# Patient Record
Sex: Male | Born: 1970 | Race: White | Hispanic: No | Marital: Married | Smoking: Current every day smoker
Health system: Southern US, Community
[De-identification: ages and names within clinical notes are randomized; demographics above are authoritative.]

## PROBLEM LIST (undated history)

## (undated) DIAGNOSIS — K5090 Crohn's disease, unspecified, without complications: Secondary | ICD-10-CM

## (undated) DIAGNOSIS — F419 Anxiety disorder, unspecified: Secondary | ICD-10-CM

## (undated) HISTORY — DX: Crohn's disease, unspecified, without complications: K50.90

## (undated) HISTORY — DX: Anxiety disorder, unspecified: F41.9

---

## 2001-06-26 ENCOUNTER — Encounter: Payer: Self-pay | Admitting: *Deleted

## 2001-06-26 ENCOUNTER — Emergency Department (HOSPITAL_COMMUNITY): Admission: EM | Admit: 2001-06-26 | Discharge: 2001-06-26 | Payer: Self-pay | Admitting: *Deleted

## 2005-12-27 ENCOUNTER — Emergency Department (HOSPITAL_COMMUNITY): Admission: EM | Admit: 2005-12-27 | Discharge: 2005-12-27 | Payer: Self-pay | Admitting: Emergency Medicine

## 2009-03-14 ENCOUNTER — Ambulatory Visit (HOSPITAL_COMMUNITY): Admission: RE | Admit: 2009-03-14 | Discharge: 2009-03-14 | Payer: Self-pay | Admitting: Internal Medicine

## 2009-03-14 IMAGING — CT CT ABDOMEN WO/W CM
2 of 7 series · 13 of 46 positions shown, 18 images · IV contrast (Omnipaque 300)
Comparison: None

CT ABDOMEN

CLINICAL DATA: Hematuria.  Bilateral flank pain.

CT ABDOMEN AND PELVIS WITHOUT AND WITH CONTRAST
TECHNIQUE: Multidetector CT imaging of the abdomen and pelvis was
performed following the standard protocol before and following the
bolus administration of intravenous contrast.
Contrast: 100 ml Gmnipaque-BLL

[Series 4: mpr coro pre contrast (id) · coronal · non-contrast · 0.78mm/px · 3 of 84 slices shown]
[im 21/84  soft-tissue]
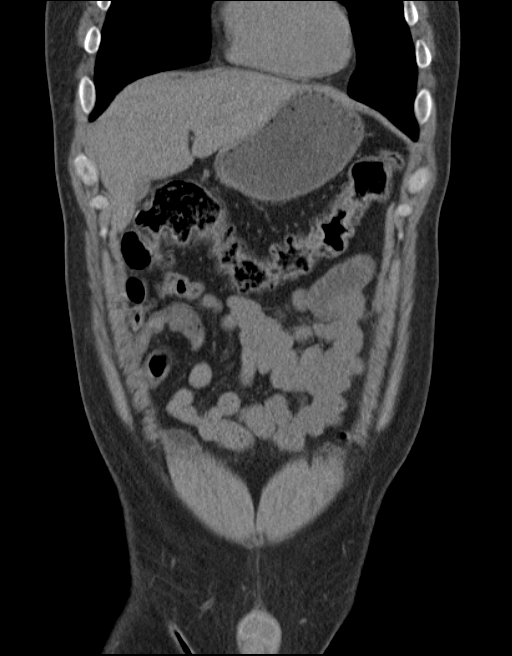
[im 42/84  soft-tissue]
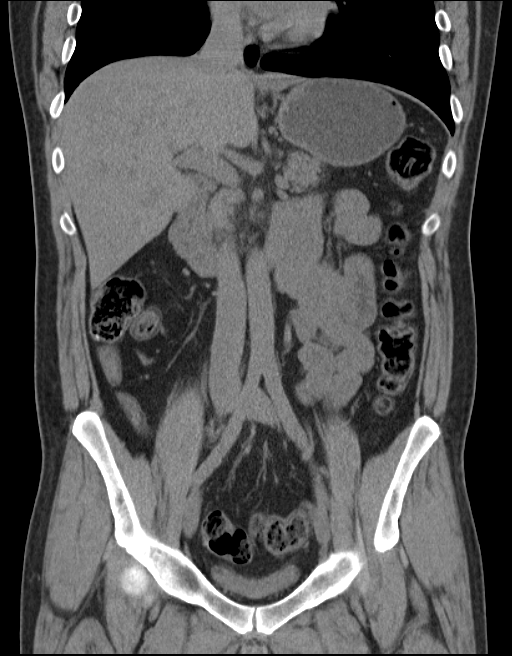
[im 63/84  soft-tissue]
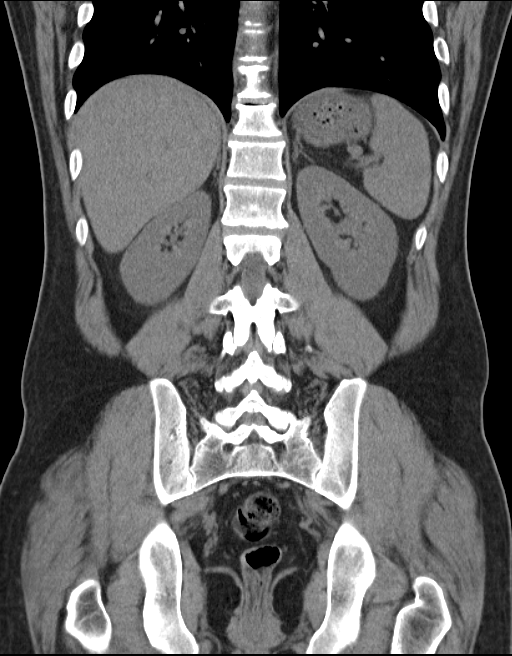

[Series 7: abd_pel_with 5.0 b40f · axial · 0.82mm/px · z∈[-452,-12]mm · 10 of 104 slices shown, 15 images]
[im 8/104  soft-tissue]
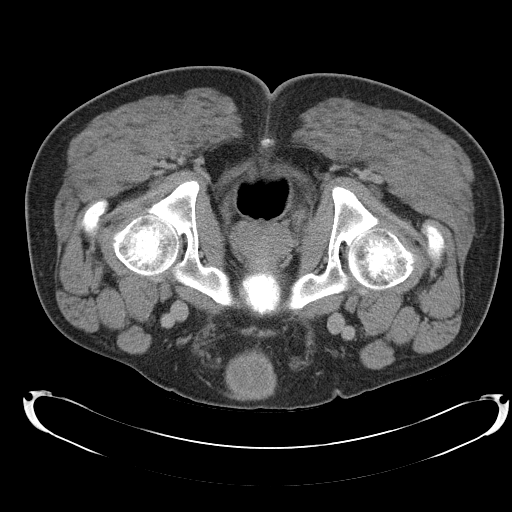
[im 8/104  bone]
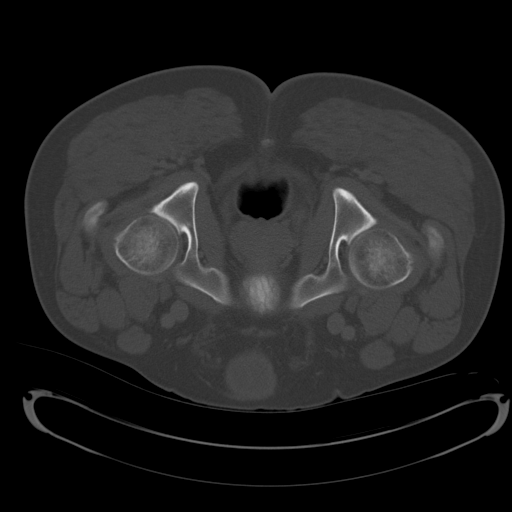
[im 24/104  soft-tissue]
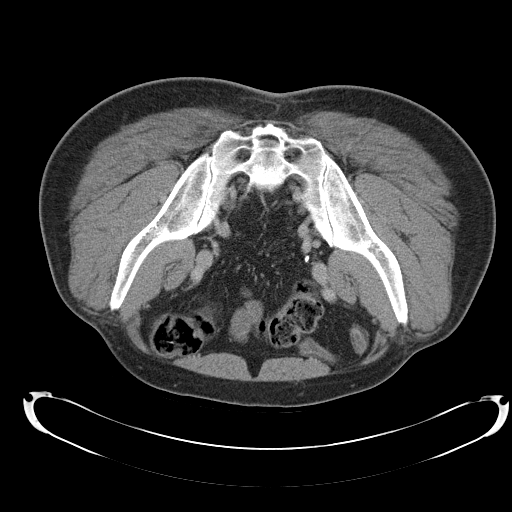
[im 32/104  soft-tissue]
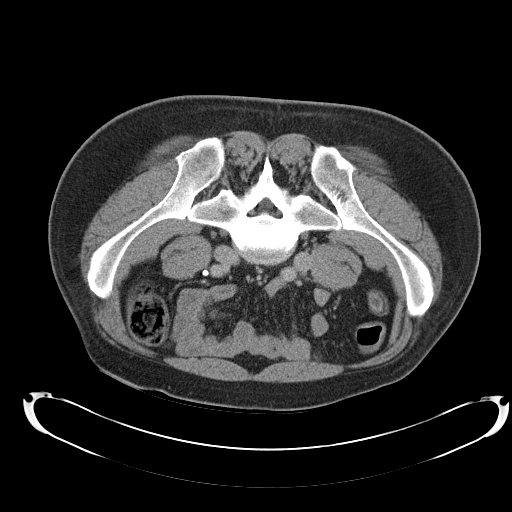
[im 40/104  soft-tissue]
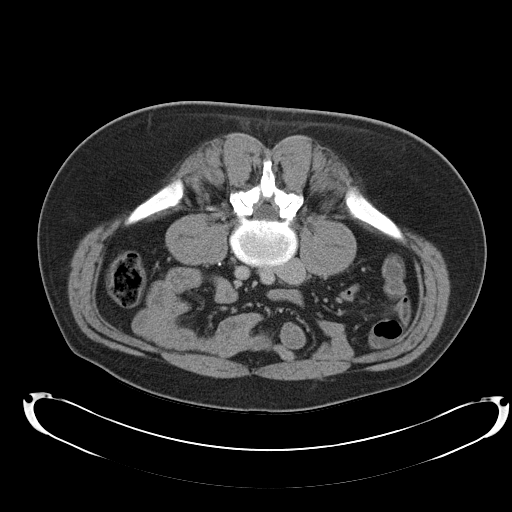
[im 56/104  soft-tissue]
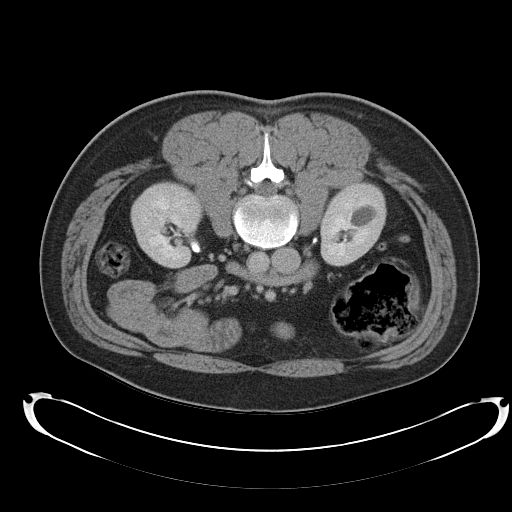
[im 64/104  soft-tissue]
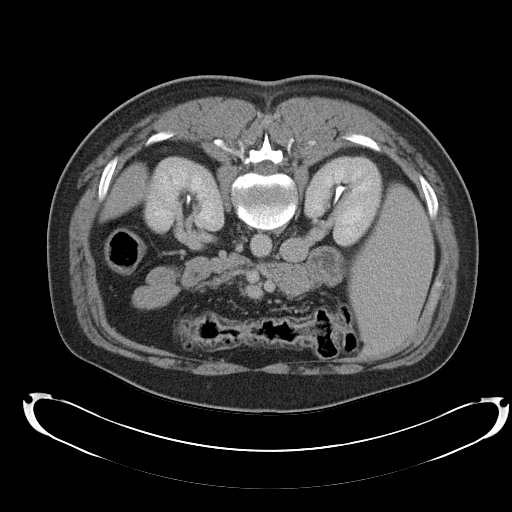
[im 72/104  soft-tissue]
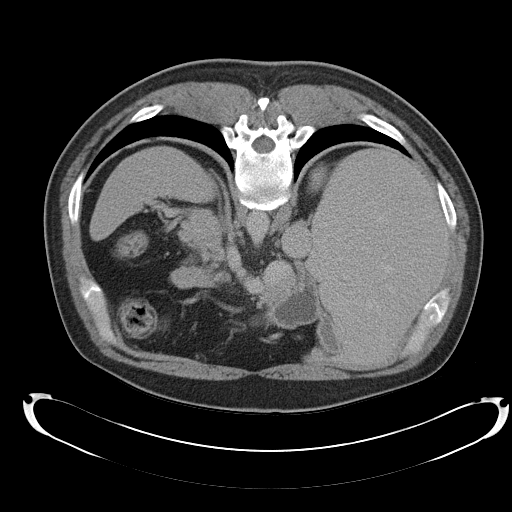
[im 72/104  lung]
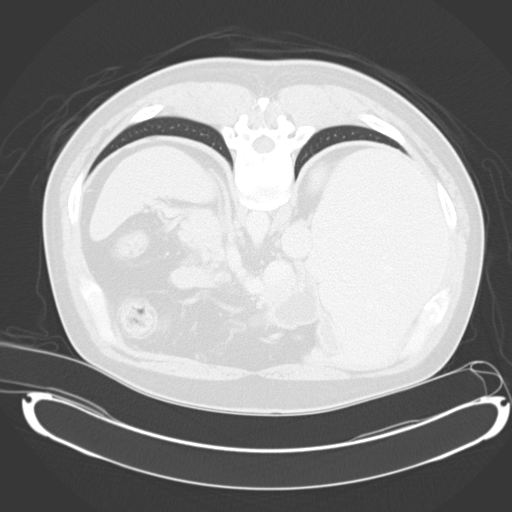
[im 80/104  lung]
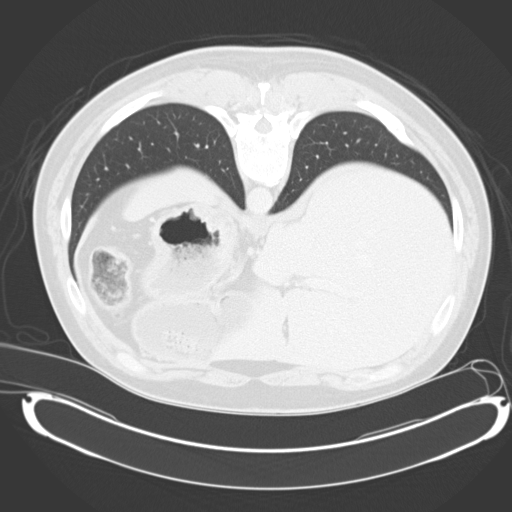
[im 88/104  soft-tissue]
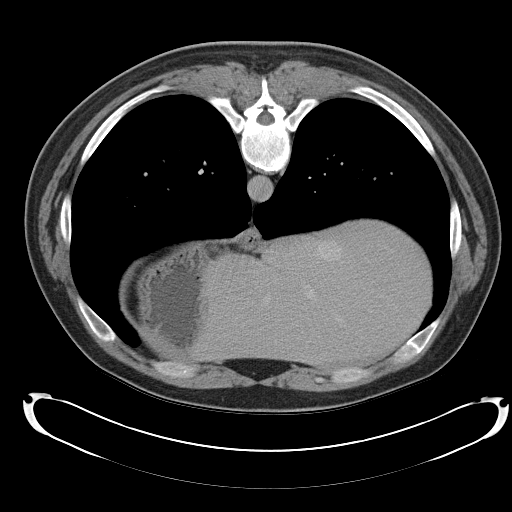
[im 88/104  lung]
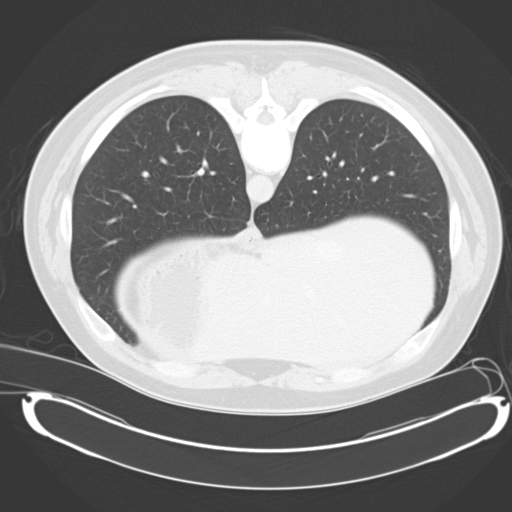
[im 96/104  soft-tissue]
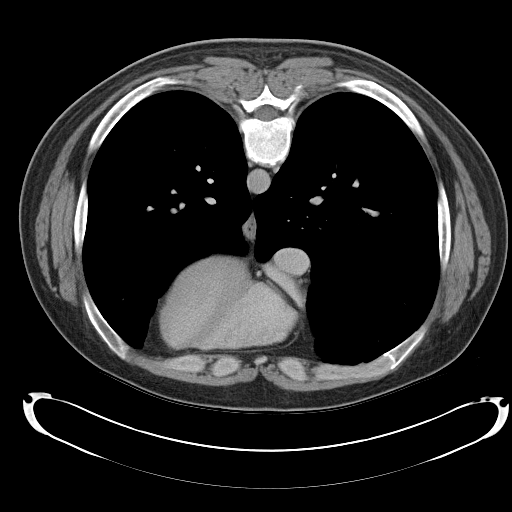
[im 96/104  lung]
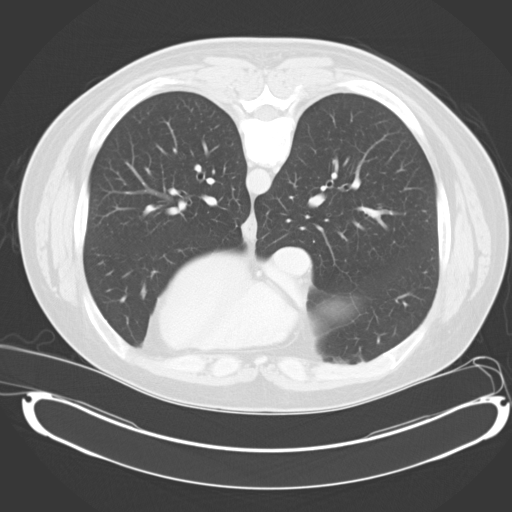
[im 96/104  bone]
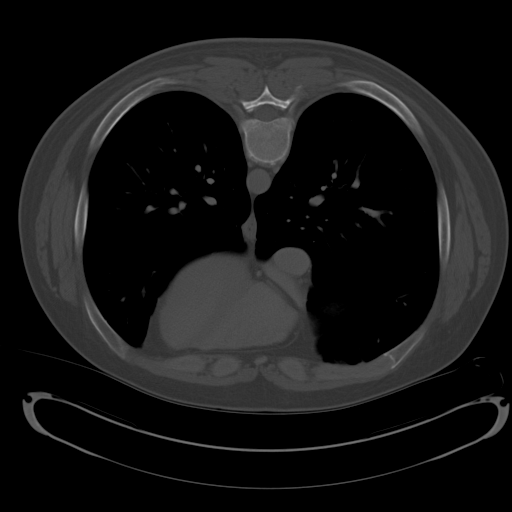

[13 of 46 positions shown; findings below may reference images not displayed]

FINDINGS: Lung bases are clear.  No pleural or pericardial fluid.
The liver appears normal without focal lesions or biliary ductal
dilatation.  No calcified gallstones.  The spleen, pancreas and
adrenal glands are normal.  There is no evidence of urinary tract
stone disease or hydroureteronephrosis.  The left kidney is normal.
The right kidney shows a well-circumscribed low density abnormality
in the lower pole measuring 2.3 x 2.4 x 1.5 cm.  This is fluid
density and consistent with a simple cyst.

The aorta and IVC are normal.  No retroperitoneal mass or
adenopathy.  No free intraperitoneal fluid or air.  No bowel
pathology is evident.  The appendix is seen and appears normal.  No
ascites.
IMPRESSION: No evidence of urinary tract stone disease.  2.3 x 2.4 x 1.5 cm
cyst in the lower pole of the right kidney this should not be of
clinical relevance.  No sign of acute pathology.

CT PELVIS
FINDINGS: No evidence of urinary tract stone disease in the
pelvis.  The bladder appears unremarkable.  The prostate gland and
seminal vesicles appear normal.  No pelvic mass or adenopathy.  No
bowel pathology is evident.
IMPRESSION: Negative CT scan of the pelvis

## 2009-08-29 ENCOUNTER — Emergency Department (HOSPITAL_COMMUNITY): Admission: EM | Admit: 2009-08-29 | Discharge: 2009-08-29 | Payer: Self-pay | Admitting: Emergency Medicine
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
## 2009-12-30 ENCOUNTER — Emergency Department (HOSPITAL_COMMUNITY): Admission: EM | Admit: 2009-12-30 | Discharge: 2009-12-30 | Payer: Self-pay | Admitting: Family Medicine

## 2009-12-30 IMAGING — CR DG CHEST 2V
2 series · 2 of 2 positions shown · non-contrast
Comparison: None available.

CLINICAL DATA: Cough and congestion.  Flu symptoms.

CHEST - 2 VIEW

[view not recorded (1 of 2)]
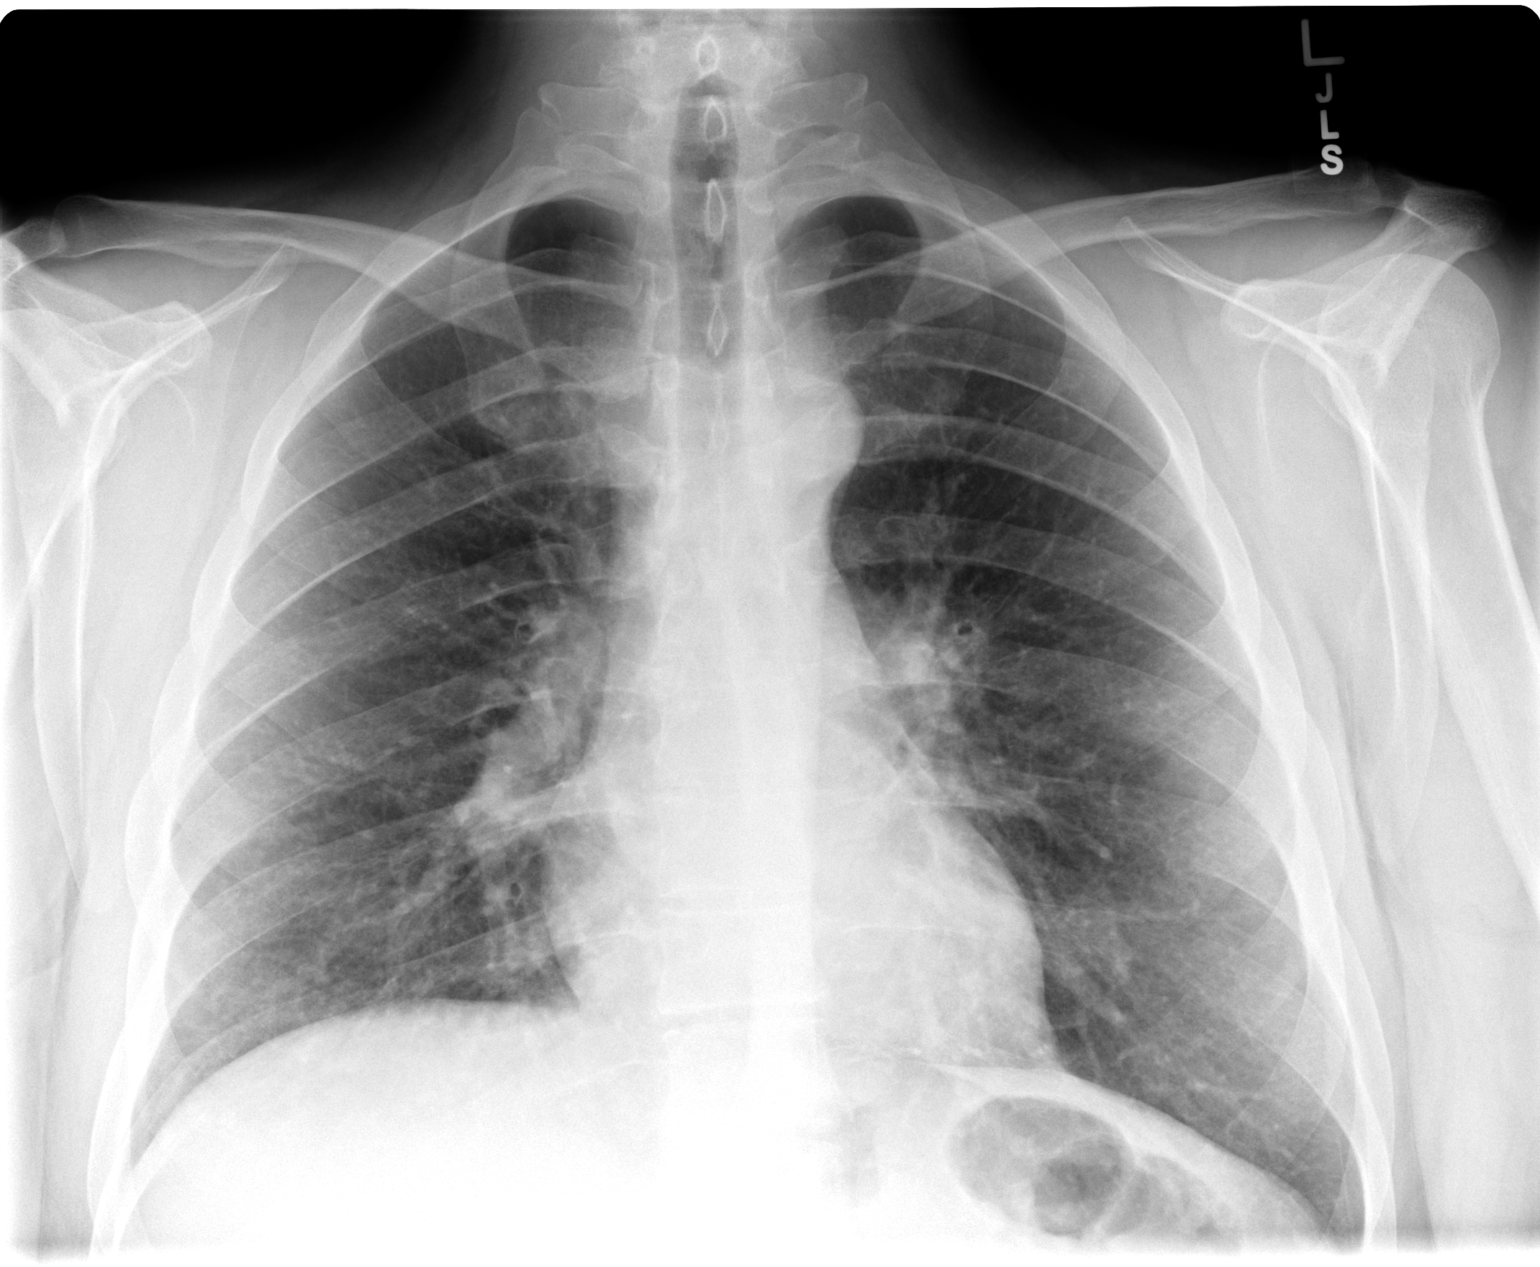

[view not recorded (2 of 2)]
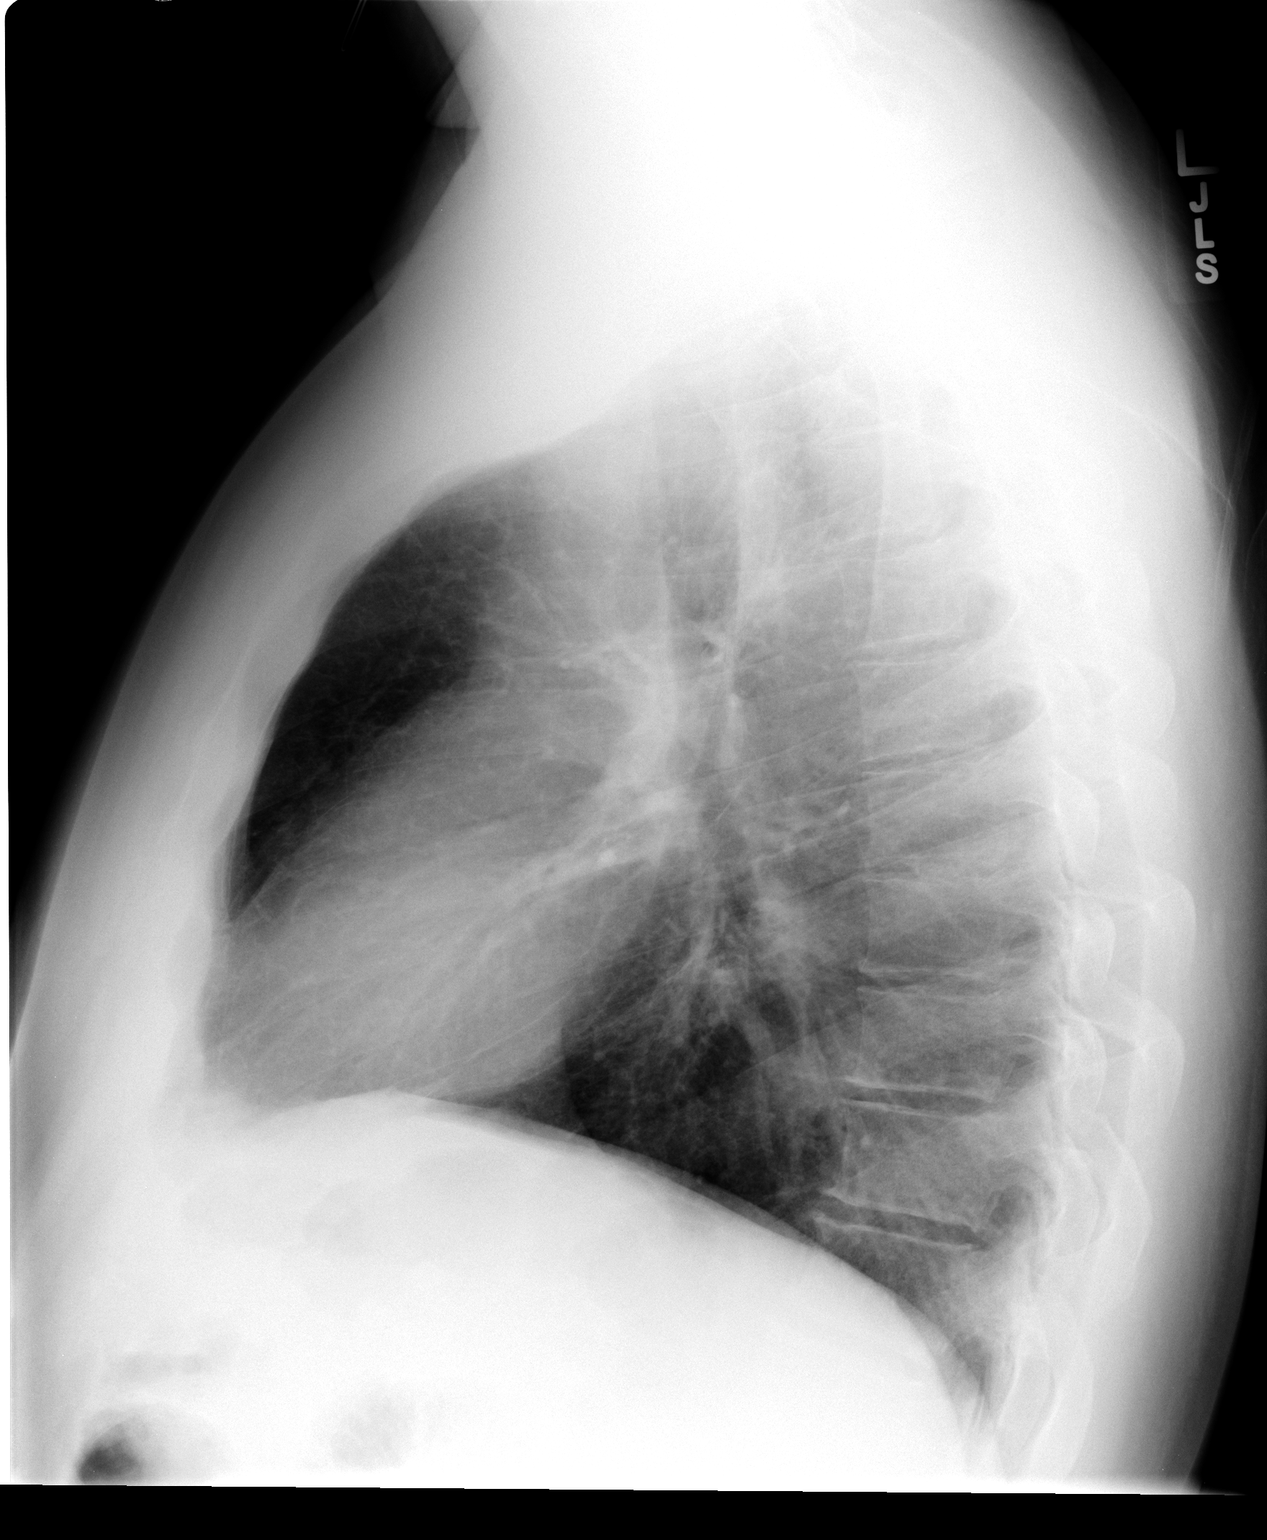

[2 of 2 positions shown; findings below may reference images not displayed]

FINDINGS: There is a peribronchial thickening compatible with
bronchitis.  No focal airspace disease or effusion.  Heart size
normal.
IMPRESSION: Bronchitic change without focal process.

## 2011-07-17 ENCOUNTER — Inpatient Hospital Stay (INDEPENDENT_AMBULATORY_CARE_PROVIDER_SITE_OTHER)
Admission: RE | Admit: 2011-07-17 | Discharge: 2011-07-17 | Disposition: A | Discharge status: Home / Self Care | Payer: 59 | Source: Ambulatory Visit | Attending: Family Medicine | Admitting: Family Medicine

## 2011-07-17 DIAGNOSIS — H60399 Other infective otitis externa, unspecified ear: Secondary | ICD-10-CM

## 2015-08-31 ENCOUNTER — Emergency Department (HOSPITAL_COMMUNITY)
Admission: EM | Admit: 2015-08-31 | Discharge: 2015-08-31 | Disposition: A | Discharge status: Home / Self Care | Payer: Worker's Compensation | Attending: Emergency Medicine | Admitting: Emergency Medicine

## 2015-08-31 ENCOUNTER — Emergency Department (HOSPITAL_COMMUNITY): Payer: Worker's Compensation

## 2015-08-31 ENCOUNTER — Encounter (HOSPITAL_COMMUNITY): Payer: Self-pay | Admitting: Emergency Medicine

## 2015-08-31 DIAGNOSIS — Z720 Tobacco use: Secondary | ICD-10-CM | POA: Insufficient documentation

## 2015-08-31 DIAGNOSIS — T543X1A Toxic effect of corrosive alkalis and alkali-like substances, accidental (unintentional), initial encounter: Secondary | ICD-10-CM | POA: Diagnosis not present

## 2015-08-31 DIAGNOSIS — X58XXXA Exposure to other specified factors, initial encounter: Secondary | ICD-10-CM | POA: Insufficient documentation

## 2015-08-31 DIAGNOSIS — Z77098 Contact with and (suspected) exposure to other hazardous, chiefly nonmedicinal, chemicals: Secondary | ICD-10-CM

## 2015-08-31 DIAGNOSIS — Y9389 Activity, other specified: Secondary | ICD-10-CM | POA: Insufficient documentation

## 2015-08-31 DIAGNOSIS — Y998 Other external cause status: Secondary | ICD-10-CM | POA: Insufficient documentation

## 2015-08-31 DIAGNOSIS — E860 Dehydration: Secondary | ICD-10-CM

## 2015-08-31 DIAGNOSIS — Y92410 Unspecified street and highway as the place of occurrence of the external cause: Secondary | ICD-10-CM | POA: Insufficient documentation

## 2015-08-31 LAB — I-STAT CHEM 8, ED
BUN: 19 mg/dL (ref 6–20)
CHLORIDE: 100 mmol/L — AB (ref 101–111)
Calcium, Ion: 1.1 mmol/L — ABNORMAL LOW (ref 1.12–1.23)
Creatinine, Ser: 1 mg/dL (ref 0.61–1.24)
Glucose, Bld: 94 mg/dL (ref 65–99)
HEMATOCRIT: 45 % (ref 39.0–52.0)
HEMOGLOBIN: 15.3 g/dL (ref 13.0–17.0)
POTASSIUM: 4.4 mmol/L (ref 3.5–5.1)
SODIUM: 137 mmol/L (ref 135–145)
TCO2: 25 mmol/L (ref 0–100)

## 2015-08-31 LAB — CARBOXYHEMOGLOBIN
Carboxyhemoglobin: 7.9 % (ref 0.5–1.5)
METHEMOGLOBIN: 1 % (ref 0.0–1.5)
O2 Saturation: 34.9 %
Total hemoglobin: 14.5 g/dL (ref 13.5–18.0)
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
## 2015-08-31 NOTE — ED Notes (Signed)
Pt given ice water.
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
## 2015-08-31 NOTE — Discharge Instructions (Signed)
Dehydration, Adult Dehydration is when you lose more fluids from the body than you take in. Vital organs like the kidneys, brain, and heart cannot function without a proper amount of fluids and salt. Any loss of fluids from the body can cause dehydration.  CAUSES   Vomiting.  Diarrhea.  Excessive sweating.  Excessive urine output.  Fever. SYMPTOMS  Mild dehydration  Thirst.  Dry lips.  Slightly dry mouth. Moderate dehydration  Very dry mouth.  Sunken eyes.  Skin does not bounce back quickly when lightly pinched and released.  Dark urine and decreased urine production.  Decreased tear production.  Headache. Severe dehydration  Very dry mouth.  Extreme thirst.  Rapid, weak pulse (more than 100 beats per minute at rest).  Cold hands and feet.  Not able to sweat in spite of heat and temperature.  Rapid breathing.  Blue lips.  Confusion and lethargy.  Difficulty being awakened.  Minimal urine production.  No tears. DIAGNOSIS  Your caregiver will diagnose dehydration based on your symptoms and your exam. Blood and urine tests will help confirm the diagnosis. The diagnostic evaluation should also identify the cause of dehydration. TREATMENT  Treatment of mild or moderate dehydration can often be done at home by increasing the amount of fluids that you drink. It is best to drink small amounts of fluid more often. Drinking too much at one time can make vomiting worse. Refer to the home care instructions below. Severe dehydration needs to be treated at the hospital where you will probably be given intravenous (IV) fluids that contain water and electrolytes. HOME CARE INSTRUCTIONS   Ask your caregiver about specific rehydration instructions.  Drink enough fluids to keep your urine clear or pale yellow.  Drink small amounts frequently if you have nausea and vomiting.  Eat as you normally do.  Avoid:  Foods or drinks high in sugar.  Carbonated  drinks.  Juice.  Extremely hot or cold fluids.  Drinks with caffeine.  Fatty, greasy foods.  Alcohol.  Tobacco.  Overeating.  Gelatin desserts.  Wash your hands well to avoid spreading bacteria and viruses.  Only take over-the-counter or prescription medicines for pain, discomfort, or fever as directed by your caregiver.  Ask your caregiver if you should continue all prescribed and over-the-counter medicines.  Keep all follow-up appointments with your caregiver. SEEK MEDICAL CARE IF:  You have abdominal pain and it increases or stays in one area (localizes).  You have a rash, stiff neck, or severe headache.  You are irritable, sleepy, or difficult to awaken.  You are weak, dizzy, or extremely thirsty. SEEK IMMEDIATE MEDICAL CARE IF:   You are unable to keep fluids down or you get worse despite treatment.  You have frequent episodes of vomiting or diarrhea.  You have blood or green matter (bile) in your vomit.  You have blood in your stool or your stool looks black and tarry.  You have not urinated in 6 to 8 hours, or you have only urinated a small amount of very dark urine.  You have a fever.  You faint. MAKE SURE YOU:   Understand these instructions.  Will watch your condition.  Will get help right away if you are not doing well or get worse. Document Released: 12/10/2005 Document Revised: 03/03/2012 Document Reviewed: 07/30/2011 Exit[employer] Patient Information 2015 [employer]. This information is not intended to replace advice given to you by your health care provider. Make sure you discuss any questions you have with your health care   provider.  

## 2015-08-31 NOTE — ED Notes (Signed)
Spoke with poison control, carboxyhemogolbin less than 10 is normal for smokers. Patient states his normal value is around 7.

## 2015-08-31 NOTE — ED Notes (Signed)
EKG, check lung sounds, Poison control spoke with PA about lab work. Treatment at this time is symptomatic.
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
## 2015-08-31 NOTE — ED Notes (Signed)
Critical carboxyhemoglobin 7.9 reported to Marlon Pel

## 2015-11-24 ENCOUNTER — Other Ambulatory Visit (INDEPENDENT_AMBULATORY_CARE_PROVIDER_SITE_OTHER): Payer: Self-pay | Admitting: Internal Medicine

## 2015-11-24 ENCOUNTER — Ambulatory Visit (INDEPENDENT_AMBULATORY_CARE_PROVIDER_SITE_OTHER): Payer: Commercial Managed Care - HMO | Admitting: Internal Medicine

## 2015-11-24 ENCOUNTER — Encounter (INDEPENDENT_AMBULATORY_CARE_PROVIDER_SITE_OTHER): Payer: Self-pay | Admitting: Internal Medicine

## 2015-11-24 ENCOUNTER — Telehealth (INDEPENDENT_AMBULATORY_CARE_PROVIDER_SITE_OTHER): Payer: Self-pay | Admitting: *Deleted

## 2015-11-24 DIAGNOSIS — K529 Noninfective gastroenteritis and colitis, unspecified: Secondary | ICD-10-CM

## 2015-11-24 DIAGNOSIS — R197 Diarrhea, unspecified: Secondary | ICD-10-CM

## 2015-11-24 DIAGNOSIS — Z8719 Personal history of other diseases of the digestive system: Secondary | ICD-10-CM | POA: Diagnosis not present

## 2015-11-24 DIAGNOSIS — F32A Depression, unspecified: Secondary | ICD-10-CM | POA: Insufficient documentation

## 2015-11-24 DIAGNOSIS — Z1211 Encounter for screening for malignant neoplasm of colon: Secondary | ICD-10-CM

## 2015-11-24 DIAGNOSIS — F329 Major depressive disorder, single episode, unspecified: Secondary | ICD-10-CM | POA: Insufficient documentation

## 2015-11-24 NOTE — Progress Notes (Signed)
Consulting physician: Lynwood Dawley MD Primary care physician: Glo Herring, MD  Reason for consultation;  Chronic diarrhea.  History of present illness:  Aaron Phillips's 44 year old Caucasian male who is being evaluated at request by  Dr. Everardo All for chronic diarrhea. Patient has history of mild leukocytosis for least 4 years and he was referred to Dr. Tressie Stalker by his primary care physician Dr. Gerarda Fraction. Dr. Lexine Baton felt his mild leukocytosis may be related to his diarrhea since he has history of Crohn's disease and therefore want him to be further evaluated. Patient recalls that he developed profuse diarrhea with as many as 15 stools per day associated with weight loss when he was 44 years old. He recalls that his weight dropped from 290 pounds to 210 pounds in a period of one year. He was evaluated by Dr. 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 This detailed was apparently provided to the patient by his mother. Patient was treated with prednisone and oral mesalamine for at least one year. He also had EGD but declined to undergo colonoscopy. At a later date he was told that he did not have Crohn's disease. However he has remained with chronic diarrhea. He has at least 4-6 stools per day. Majority of his stools are loose and watery or mushy. He may have formed stool every now and then. He states he does not feel well when his stool is formed. He has urgency and occasional accident. He has noted his stools to be dark but he denies melena or rectal bleeding. He does not take OTC antidiarrheals. He has good appetite. He has not lost any weight over the last few years. He feels his quality of life is 100% despite diarrhea. He watches intake of fatty and greasy foods as these foods worsen his diarrhea. He has occasional nocturnal bowel movement. He may take a couple of doses of OTC ibuprofen per month. He denies nausea vomiting  heartburn or dysphagia. He was noted to have small skin lesion at LLQ of his abdomen and Dr. Tressie Stalker recommended dermatology consultation.    Current Medications: Outpatient Encounter Prescriptions as of 11/24/2015  Medication Sig  . ibuprofen (ADVIL,MOTRIN) 200 MG tablet Take 200 mg by mouth as needed.  Marland Kitchen PARoxetine (PAXIL) 20 MG tablet Take 20 mg by mouth daily.   No facility-administered encounter medications on file as of 11/24/2015.   Past medical history:  Infectious mononucleosis at age 89. Questionable history of Crohn's. He was diagnosed at age 87 and then at a later datetold he did not have Crohn's disease. Mild depression. He had skin lesion removed from bridge of his nose last year but he does not know the pathologic diagnosis.  Allergies: No Known Allergies  Social history: He is divorced. He has a son age 25 who was diagnosed with  Nervous stomach two years ago and doing well on amitriptyline. 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 He smokes a pack of cigarettes per day and he has been smoking since he was 44 years old. He drinks alcohol occasionally.  Family history: Father is 49 years old and has hypertension and hyperlipidemia. Mother is 46 years old and has hypertension. He has a sister with WPW syndrome   Physical examination: Blood pressure 110/76, pulse 66, temperature 97.5 F (36.4 C), temperature source Oral, resp. rate 18, height 6' 2"  (1.88 m), weight  218 lb 6.4 oz (99.066 kg). Patient is alert and in no acute distress Conjunctiva is pink. Sclera is nonicteric Oropharyngeal mucosa is normal. No neck masses or thyromegaly noted. Cardiac exam with regular rhythm normal S1 and S2. No murmur or gallop noted. Lungs are clear to auscultation. Abdomen is symmetrical. Bowel sounds are normal. On palpation abdomen is soft and nontender without organomegaly or masses.  No LE  edema or clubbing noted.  Labs/studies Results:  Unenhanced abdominopelvic CT from 03/14/2009 reviewed and there appears to be mild colonic wall thickening. This study would be reviewed with Dr. Thornton Papas to get his opinion.  CBC from 11/09/2015 WBC 12.4, H&H 15.2 and 45.6 MCV 94.6 platelet count 292K. Differential reveals 68% neutrophils but no premature cells noted.  Assessment:  #1. Chronic diarrhea in a patient who was diagnosed with Crohn's disease 23 years ago and was treated with prednisone and oral mesalamine for 1 year. At a later date he was told he did not have Crohn's disease( these records are not available). He remains with chronic diarrhea without constitutional symptoms. He could have low-grade inflammatory bowel disease or IBD is in remission and chronic diarrhea may be secondary to irritable bowel syndrome. If indeed he has IBD it might explain mild chronic leukocytosis. #2. Mild leukocytosis. He does not have abnormal or premature cells. Mild leukocytosis may be secondary to GI process.  Recommendations:  Hemoccult 1. Diagnostic colonoscopy to be performed in the future.

## 2015-11-24 NOTE — Telephone Encounter (Signed)
Patient needs trilyte 

## 2015-11-24 NOTE — Patient Instructions (Signed)
Colonoscopy to be scheduled in near future.

## 2015-11-28 MED ORDER — PEG 3350-KCL-NA BICARB-NACL 420 G PO SOLR: 4000.0000 mL | Freq: Once | ORAL | Status: DC

## 2015-12-09 ENCOUNTER — Encounter (HOSPITAL_COMMUNITY)
Admission: RE | Disposition: A | Discharge status: Home / Self Care | Payer: Self-pay | Source: Ambulatory Visit | Attending: Internal Medicine

## 2015-12-09 ENCOUNTER — Ambulatory Visit (HOSPITAL_COMMUNITY)
Admission: RE | Admit: 2015-12-09 | Discharge: 2015-12-09 | Disposition: A | Discharge status: Home / Self Care | Payer: Commercial Managed Care - HMO | Source: Ambulatory Visit | Attending: Internal Medicine | Admitting: Internal Medicine

## 2015-12-09 ENCOUNTER — Encounter (HOSPITAL_COMMUNITY): Payer: Self-pay | Admitting: *Deleted

## 2015-12-09 DIAGNOSIS — K5080 Crohn's disease of both small and large intestine without complications: Secondary | ICD-10-CM | POA: Insufficient documentation

## 2015-12-09 DIAGNOSIS — R197 Diarrhea, unspecified: Secondary | ICD-10-CM

## 2015-12-09 DIAGNOSIS — D72829 Elevated white blood cell count, unspecified: Secondary | ICD-10-CM | POA: Diagnosis not present

## 2015-12-09 DIAGNOSIS — F17210 Nicotine dependence, cigarettes, uncomplicated: Secondary | ICD-10-CM | POA: Diagnosis not present

## 2015-12-09 DIAGNOSIS — Z8719 Personal history of other diseases of the digestive system: Secondary | ICD-10-CM

## 2015-12-09 DIAGNOSIS — K644 Residual hemorrhoidal skin tags: Secondary | ICD-10-CM | POA: Diagnosis not present

## 2015-12-09 DIAGNOSIS — K5289 Other specified noninfective gastroenteritis and colitis: Secondary | ICD-10-CM | POA: Diagnosis not present

## 2015-12-09 DIAGNOSIS — F419 Anxiety disorder, unspecified: Secondary | ICD-10-CM | POA: Diagnosis not present

## 2015-12-09 DIAGNOSIS — K5730 Diverticulosis of large intestine without perforation or abscess without bleeding: Secondary | ICD-10-CM | POA: Insufficient documentation

## 2015-12-09 DIAGNOSIS — K5090 Crohn's disease, unspecified, without complications: Secondary | ICD-10-CM | POA: Diagnosis not present

## 2015-12-09 DIAGNOSIS — K6389 Other specified diseases of intestine: Secondary | ICD-10-CM | POA: Diagnosis not present

## 2015-12-09 HISTORY — PX: COLONOSCOPY: SHX5424

## 2015-12-09 SURGERY — COLONOSCOPY
Anesthesia: Moderate Sedation

## 2015-12-09 MED ORDER — SODIUM CHLORIDE 0.9 % IV SOLN
INTRAVENOUS | Status: DC
  Administered 2015-12-09: 15:00:00 via INTRAVENOUS

## 2015-12-09 MED ORDER — MIDAZOLAM HCL 5 MG/5ML IJ SOLN
INTRAMUSCULAR | Status: DC | PRN
  Administered 2015-12-09: 2 mg via INTRAVENOUS
  Administered 2015-12-09: 3 mg via INTRAVENOUS
  Administered 2015-12-09: 2 mg via INTRAVENOUS
  Administered 2015-12-09: 3 mg via INTRAVENOUS

## 2015-12-09 MED ORDER — STERILE WATER FOR IRRIGATION IR SOLN
Status: DC | PRN
  Administered 2015-12-09: 16:00:00

## 2015-12-09 MED ORDER — MIDAZOLAM HCL 5 MG/5ML IJ SOLN
INTRAMUSCULAR | Status: AC
  Filled 2015-12-09: qty 10

## 2015-12-09 MED ORDER — MEPERIDINE HCL 50 MG/ML IJ SOLN
INTRAMUSCULAR | Status: DC | PRN
  Administered 2015-12-09 (×2): 25 mg via INTRAVENOUS

## 2015-12-09 MED ORDER — BUDESONIDE 3 MG PO CPEP: 9.0000 mg | ORAL_CAPSULE | Freq: Every day | ORAL | Status: DC

## 2015-12-09 MED ORDER — MEPERIDINE HCL 50 MG/ML IJ SOLN
INTRAMUSCULAR | Status: DC
Start: 2015-12-09 — End: 2015-12-09
  Filled 2015-12-09: qty 1

## 2015-12-09 NOTE — Op Note (Signed)
COLONOSCOPY PROCEDURE REPORT  PATIENT:  Aaron Phillips  MR#:  [id number] Birthdate:  05/09/1971, 44 y.o., male Endoscopist:  Dr. Rogene Houston, MD Referred By:  Dr.  Penne Lash Procedure Date: 12/09/2015  Procedure:   Colonoscopy with terminal ileoscopy  Indications:   Patient is 44 year old Caucasian male who was diagnosed with Crohn's disease and he was around 44 years old. He was treated for 1 year. Acute symptoms have resolved but he remains with diarrhea. He was recently evaluated by Dr. Ivin Poot for mild leukocytosis and referred over for further evaluation because of chronic diarrhea.  Informed Consent:  The procedure and risks were reviewed with the patient and informed consent was obtained.  Medications:  Demerol 50 mg IV Versed 10 mg IV  Description of procedure:  After a digital rectal exam was performed, that colonoscope was advanced from the anus through the rectum and colon to the area of the cecum, ileocecal valve and appendiceal orifice. The cecum was deeply intubated. These structures were well-seen and photographed for the record. From the level of the cecum and ileocecal valve, the scope was slowly and cautiously withdrawn. The mucosal surfaces were carefully surveyed utilizing scope tip to flexion to facilitate fold flattening as needed. The scope was pulled down into the rectum where a thorough exam including retroflexion was performed. Terminal  ileum was also examined.  Findings:   Prep excellent.  Terminal ileum was examined for at least 25 cm and reveal scattered erosions erythema and granularity. No stricture identified. Multiple erosions noted involving cecal in ascending colon mucosa along with few more at sigmoid colon and rectum.  Patchy scarring noted throughout the colon.  Two diverticula noted at hepatic flexure.  Small hemorrhoids below the dentate line.   Therapeutic/Diagnostic Maneuvers Performed:    Multiple biopsies taken from mucosa of  terminal ileum.   Multiple biopsies taken from cecal mucosa.  Complications:   none  EBL: Minimal  Cecal Withdrawal Time:   17 minutes  Impression:   ileocolonic Crohn's disease which appears to be mild based on endoscopic findings. Patchy scarring noted throughout the colon indicated of more active disease in the past. Biopsies taken from ileal and cecal mucosa.  Two diverticula at hepatic flexure.  Small external hemorrhoids  Recommendations:  Standard instructions given.  Budesonide 9 mg daily for 2 weeks, 6 mg daily for 2 weeks and 3 mg daily for  2 weeks,  I will contact patient with biopsy results and further recommendations.  REHMAN,NAJEEB U  12/09/2015 4:52 PM  CC: Dr. Glo Herring., MD & Dr. Rayne Du ref. provider found CC: Dr. Everardo All, MD

## 2015-12-09 NOTE — H&P (Signed)
Aaron Phillips is an 44 y.o. male.   Chief Complaint:  Patient is here for colonoscopy. HPI:  Aaron Phillips is 44 year old Caucasian male who was diagnosed with Crohn's disease and he was 44 years old. He was treated with prednisone and oral mesalamine for about a year. He has not had any further evaluation or treatment. He remains with chronic diarrhea. Usually has 4-6 stools per day. He denies abdominal pain melena or rectal bleeding. He was recently evaluated by Dr. Tressie Stalker for mild leukocytosis. He felt mild leukocytosis may be due to underlying inflammatory bowel disease NSAID and further evaluation.  Details of history and physical can be found in my note from 10/25/2015.  Past Medical History  Diagnosis Date  . Anxiety   . Crohn's disease ([place])     As a child he was dx.    History reviewed. No pertinent past surgical history.  Family History  Problem Relation Age of Onset  . Hypertension Mother   . Hypertension Father   . Aaron Phillips Parkinson White syndrome Sister    Social History:  reports that he has been smoking.  He uses smokeless tobacco. He reports that he drinks alcohol. His drug history is not on file.  Allergies: No Known Allergies  Medications Prior to Admission  Medication Sig Dispense Refill  . ibuprofen (ADVIL,MOTRIN) 200 MG tablet Take 200 mg by mouth as needed.    . polyethylene glycol-electrolytes (NULYTELY/GOLYTELY) 420 G solution Take 4,000 mLs by mouth once. 4000 mL 0  . PARoxetine (PAXIL) 20 MG tablet Take 20 mg by mouth daily.      No results found for this or any previous visit (from the past 48 hour(s)). No results found.  ROS  Blood pressure 124/89, pulse 73, temperature 98 F (36.7 C), temperature source Oral, resp. rate 13, height 6' 2"  (1.88 m), weight 210 lb (95.255 kg), SpO2 100 %. Physical Exam  Constitutional: He appears well-developed and well-nourished.  HENT:  Mouth/Throat: Oropharynx is clear and moist.  Eyes: Conjunctivae are normal. No  scleral icterus.  Neck: No thyromegaly present.  Cardiovascular: Normal rate, regular rhythm and normal heart sounds.   No murmur heard. Respiratory: Effort normal and breath sounds normal.  GI: Soft. He exhibits no distension and no mass. There is no tenderness.  Musculoskeletal: He exhibits no edema.  Lymphadenopathy:    He has no cervical adenopathy.  Neurological: He is alert.  Skin: Skin is warm and dry.     Assessment/Plan  Chronic diarrhea.  History of Crohn's disease.  Diagnostic colonoscopy.  Aaron Phillips 12/09/2015, 3:53 PM

## 2015-12-09 NOTE — Discharge Instructions (Signed)
Resume usual medications and diet.  Budesonide as follows;  9 mgby mouth daily for 2 weeks;  6 mg by mouth daily for 2 weeks;  3 mg by mouth daily for 2 weeks;  No driving for 24 hours.  Physician will call with biopsy results and further recommendations.   Colonoscopy, Care After Refer to this sheet in the next few weeks. These instructions provide you with information on caring for yourself after your procedure. Your health care provider may also give you more specific instructions. Your treatment has been planned according to current medical practices, but problems sometimes occur. Call your health care provider if you have any problems or questions after your procedure. WHAT TO EXPECT AFTER THE PROCEDURE  After your procedure, it is typical to have the following:  A small amount of blood in your stool.  Moderate amounts of gas and mild abdominal cramping or bloating. HOME CARE INSTRUCTIONS  Do not drive, operate machinery, or sign important documents for 24 hours.  You may shower and resume your regular physical activities, but move at a slower pace for the first 24 hours.  Take frequent rest periods for the first 24 hours.  Walk around or put a warm pack on your abdomen to help reduce abdominal cramping and bloating.  Drink enough fluids to keep your urine clear or pale yellow.  You may resume your normal diet as instructed by your health care provider. Avoid heavy or fried foods that are hard to digest.  Avoid drinking alcohol for 24 hours or as instructed by your health care provider.  Only take over-the-counter or prescription medicines as directed by your health care provider.  If a tissue sample (biopsy) was taken during your procedure:  Do not take aspirin or blood thinners for 7 days, or as instructed by your health care provider.  Do not drink alcohol for 7 days, or as instructed by your health care provider.  Eat soft foods for the first 24 hours. SEEK MEDICAL  CARE IF: You have persistent spotting of blood in your stool 2-3 days after the procedure. SEEK IMMEDIATE MEDICAL CARE IF:  You have more than a small spotting of blood in your stool.  You pass large blood clots in your stool.  Your abdomen is swollen (distended).  You have nausea or vomiting.  You have a fever.  You have increasing abdominal pain that is not relieved with medicine.   This information is not intended to replace advice given to you by your health care provider. Make sure you discuss any questions you have with your health care provider.   Document Released: 07/24/2004 Document Revised: 09/30/2013 Document Reviewed: 08/17/2013 Elsevier Interactive Patient Education 2016 [employer] Inc[employer]  Colitis Colitis is inflammation of the colon. Colitis may last a short time (acute) or it may last a long time (chronic). CAUSES This condition may be caused by:  Viruses.  Bacteria.  Reactions to medicine.  Certain autoimmune diseases, such as Crohn disease or ulcerative colitis. SYMPTOMS Symptoms of this condition include:  Diarrhea.  Passing bloody or tarry stool.  Pain.  Fever.  Vomiting.  Tiredness (fatigue).  Weight loss.  Bloating.  Sudden increase in abdominal pain.  Having fewer bowel movements than usual. DIAGNOSIS This condition is diagnosed with a stool test or a blood test. You may also have other tests, including X-rays, a CT scan, or a colonoscopy. TREATMENT Treatment may include:  Resting the bowel. This involves not eating or drinking for a period of time.  Fluids that are given through an IV tube.  Medicine for pain and diarrhea.  Antibiotic medicines.  Cortisone medicines.  Surgery. HOME CARE INSTRUCTIONS Eating and Drinking  Follow instructions from your health care provider about eating or drinking restrictions.  Drink enough fluid to keep your urine clear or pale yellow.  Work with a dietitian to determine which foods  cause your condition to flare up.  Avoid foods that cause flare-ups.  Eat a well-balanced diet. Medicines  Take over-the-counter and prescription medicines only as told by your health care provider.  If you were prescribed an antibiotic medicine, take it as told by your health care provider. Do not stop taking the antibiotic even if you start to feel better. General Instructions  Keep all follow-up visits as told by your health care provider. This is important. SEEK MEDICAL CARE IF:  Your symptoms do not go away.  You develop new symptoms. SEEK IMMEDIATE MEDICAL CARE IF:  You have a fever that does not go away with treatment.  You develop chills.  You have extreme weakness, fainting, or dehydration.  You have repeated vomiting.  You develop severe pain in your abdomen.  You pass bloody or tarry stool.   This information is not intended to replace advice given to you by your health care provider. Make sure you discuss any questions you have with your health care provider.   Document Released: 01/17/2005 Document Revised: 08/31/2015 Document Reviewed: 04/04/2015 Elsevier Interactive Patient Education [employer]

## 2015-12-15 ENCOUNTER — Encounter (HOSPITAL_COMMUNITY): Payer: Self-pay | Admitting: Internal Medicine

## 2015-12-23 ENCOUNTER — Telehealth (INDEPENDENT_AMBULATORY_CARE_PROVIDER_SITE_OTHER): Payer: Self-pay | Admitting: *Deleted

## 2015-12-23 NOTE — Telephone Encounter (Signed)
Patient called stating Dr. Karilyn Cota done a colonoscopy on him before christmas and he was supposed to be calling in medications for him to [facility] in [place], patient states nothing has been called in for him at this time Please advise  [phone]

## 2015-12-23 NOTE — Telephone Encounter (Signed)
I called the [facility] in [place]. A verbal order was given for the Entocort EC 3 mg 24 hour capsule. Patient is to take 3 capsule ( 9 mg total) for 2 weeks. Take 2 capsules (6 mg total) for 2 weeks , Take 1 capsule (3mg  total) for 2 weeks.  Aaron Phillips states that this would be ready at 10:40 am , patient was called and made aware.

## 2015-12-25 HISTORY — PX: INGUINAL HERNIA REPAIR: SUR1180

## 2016-04-21 ENCOUNTER — Encounter (HOSPITAL_COMMUNITY): Payer: Self-pay | Admitting: *Deleted

## 2016-04-21 ENCOUNTER — Ambulatory Visit (INDEPENDENT_AMBULATORY_CARE_PROVIDER_SITE_OTHER): Payer: Commercial Managed Care - HMO | Admitting: Family Medicine

## 2016-04-21 ENCOUNTER — Emergency Department (HOSPITAL_COMMUNITY)
Admission: EM | Admit: 2016-04-21 | Discharge: 2016-04-21 | Disposition: A | Discharge status: Home / Self Care | Payer: Commercial Managed Care - HMO | Attending: Emergency Medicine | Admitting: Emergency Medicine

## 2016-04-21 VITALS — BP 120/74 | HR 68 | Temp 99.0°F | Resp 12 | Ht 74.0 in | Wt 219.0 lb

## 2016-04-21 DIAGNOSIS — Z792 Long term (current) use of antibiotics: Secondary | ICD-10-CM | POA: Diagnosis not present

## 2016-04-21 DIAGNOSIS — Z8719 Personal history of other diseases of the digestive system: Secondary | ICD-10-CM | POA: Diagnosis not present

## 2016-04-21 DIAGNOSIS — F419 Anxiety disorder, unspecified: Secondary | ICD-10-CM | POA: Diagnosis not present

## 2016-04-21 DIAGNOSIS — F17210 Nicotine dependence, cigarettes, uncomplicated: Secondary | ICD-10-CM | POA: Diagnosis not present

## 2016-04-21 DIAGNOSIS — J36 Peritonsillar abscess: Secondary | ICD-10-CM | POA: Insufficient documentation

## 2016-04-21 DIAGNOSIS — R220 Localized swelling, mass and lump, head: Secondary | ICD-10-CM | POA: Diagnosis present

## 2016-04-21 LAB — CBC WITH DIFFERENTIAL/PLATELET
BASOS ABS: 0 10*3/uL (ref 0.0–0.1)
Basophils Relative: 0 %
Eosinophils Absolute: 0 10*3/uL (ref 0.0–0.7)
Eosinophils Relative: 0 %
HEMATOCRIT: 42.3 % (ref 39.0–52.0)
HEMOGLOBIN: 14.4 g/dL (ref 13.0–17.0)
LYMPHS PCT: 3 %
Lymphs Abs: 1 10*3/uL (ref 0.7–4.0)
MCH: 32.1 pg (ref 26.0–34.0)
MCHC: 34 g/dL (ref 30.0–36.0)
MCV: 94.4 fL (ref 78.0–100.0)
MONOS PCT: 6 %
Monocytes Absolute: 2 10*3/uL — ABNORMAL HIGH (ref 0.1–1.0)
NEUTROS PCT: 91 %
Neutro Abs: 30.1 10*3/uL — ABNORMAL HIGH (ref 1.7–7.7)
Platelets: 278 10*3/uL (ref 150–400)
RBC: 4.48 MIL/uL (ref 4.22–5.81)
RDW: 13.3 % (ref 11.5–15.5)
WBC: 33.1 10*3/uL — AB (ref 4.0–10.5)

## 2016-04-21 LAB — BASIC METABOLIC PANEL
ANION GAP: 10 (ref 5–15)
BUN: 7 mg/dL (ref 6–20)
CHLORIDE: 100 mmol/L — AB (ref 101–111)
CO2: 23 mmol/L (ref 22–32)
Calcium: 9.2 mg/dL (ref 8.9–10.3)
Creatinine, Ser: 0.9 mg/dL (ref 0.61–1.24)
GFR calc non Af Amer: >60 mL/min (ref >60)
GLUCOSE: 107 mg/dL — AB (ref 65–99)
Potassium: 4 mmol/L (ref 3.5–5.1)
Sodium: 133 mmol/L — ABNORMAL LOW (ref 135–145)

## 2016-04-21 MED ORDER — LIDOCAINE-EPINEPHRINE 2 %-1:100000 IJ SOLN
20.0000 mL | Freq: Once | INTRAMUSCULAR | Status: DC
  Filled 2016-04-21: qty 20

## 2016-04-21 MED ORDER — MORPHINE SULFATE (PF) 4 MG/ML IV SOLN
4.0000 mg | INTRAVENOUS | Status: AC | PRN
  Administered 2016-04-21 (×3): 4 mg via INTRAVENOUS
  Filled 2016-04-21 (×3): qty 1

## 2016-04-21 MED ORDER — AMOXICILLIN 250 MG/5ML PO SUSR: 500.0000 mg | Freq: Three times a day (TID) | ORAL | Status: DC

## 2016-04-21 MED ORDER — HYDROCODONE-ACETAMINOPHEN 7.5-325 MG/15ML PO SOLN: 10.0000 mL | ORAL | Status: DC | PRN

## 2016-04-21 MED ORDER — ONDANSETRON HCL 4 MG/2ML IJ SOLN
4.0000 mg | Freq: Once | INTRAMUSCULAR | Status: AC
  Administered 2016-04-21: 4 mg via INTRAVENOUS
  Filled 2016-04-21: qty 2

## 2016-04-21 MED ORDER — AMOXICILLIN 250 MG/5ML PO SUSR
1000.0000 mg | Freq: Three times a day (TID) | ORAL | Status: DC
  Administered 2016-04-21: 1000 mg via ORAL
  Filled 2016-04-21 (×2): qty 20

## 2016-04-21 MED ORDER — SODIUM CHLORIDE 0.9 % IV BOLUS (SEPSIS)
1000.0000 mL | Freq: Once | INTRAVENOUS | Status: AC
  Administered 2016-04-21: 1000 mL via INTRAVENOUS

## 2016-04-21 MED ORDER — LIDOCAINE-EPINEPHRINE (PF) 2 %-1:200000 IJ SOLN
20.0000 mL | Freq: Once | INTRAMUSCULAR | Status: DC
  Filled 2016-04-21: qty 20

## 2016-04-21 MED ORDER — AMOXICILLIN 250 MG/5ML PO SUSR: 250.0000 mg | Freq: Three times a day (TID) | ORAL | Status: DC

## 2016-04-21 MED ORDER — AMOXICILLIN 250 MG/5ML PO SUSR: ORAL | Status: DC

## 2016-04-21 MED ORDER — AMOXICILLIN 250 MG/5ML PO SUSR: 1000.0000 mg | Freq: Three times a day (TID) | ORAL | Status: DC

## 2016-04-21 NOTE — Patient Instructions (Addendum)
  Thank you for coming in today. Directly to [facility] emergency room   IF you received an x-ray today, you will receive an invoice from [facility]. Please contact [facility] at [phone] with questions or concerns regarding your invoice.   IF you received labwork today, you will receive an invoice from [employer]. Please contact [facility] at [phone] with questions or concerns regarding your invoice.   Our billing staff will not be able to assist you with questions regarding bills from these companies.  You will be contacted with the lab results as soon as they are available. The fastest way to get your results is to activate your My Chart account. Instructions are located on the last page of this paperwork. If you have not heard from [place] regarding the results in 2 weeks, please contact this office.

## 2016-04-21 NOTE — ED Provider Notes (Signed)
CSN: [id number]     Arrival date & time 04/21/16  1032 History   First MD Initiated Contact with Patient 04/21/16 1107     Chief Complaint  Patient presents with  . Oral Swelling   HPI Patient presents to the emergency room with complaints of sore throat. Initially the symptoms started 2 days ago. Pain has progressed and now he is having pain primarily on the right side of his throat. He is now having swelling on the right side of his neck. His lymph nodes are sore and tender. He is also having difficulty opening his mouth. Patient went to an urgent care today and was diagnosed with a peritonsillar abscess. He was sent to the emergency room for evaluation. Past Medical History  Diagnosis Date  . Anxiety   . Crohn's disease ([place])     As a child he was dx.   Past Surgical History  Procedure Laterality Date  . Colonoscopy N/A 12/09/2015    Procedure: COLONOSCOPY;  Surgeon: Rogene Houston, MD;  Location: [facility];  Service: Endoscopy;  Laterality: N/A;  72   Family History  Problem Relation Age of Onset  . Hypertension Mother   . Hypertension Father   . Yves Dill Parkinson White syndrome Sister    Social History  Substance Use Topics  . Smoking status: Current Every Day Smoker  . Smokeless tobacco: Current User     Comment: Patient states that he uses dip occasional  . Alcohol Use: 0.0 oz/week    0 Standard drinks or equivalent per week    Review of Systems  Constitutional: Fever: he is not sure if he's had a fever but he has felt feverish.  All other systems reviewed and are negative.     Allergies  Review of patient's allergies indicates no known allergies.  Home Medications   Prior to Admission medications   Medication Sig Start Date End Date Taking? Authorizing Provider  amoxicillin (AMOXIL) 250 MG/5ML suspension Take 5 mLs (250 mg total) by mouth every 8 (eight) hours. 5/95/63   Jodi Marble, MD  amoxicillin (AMOXIL) 250 MG/5ML suspension Take 20 mLs (1,000 mg  total) by mouth 3 (three) times daily. 8/75/64   Jodi Marble, MD  amoxicillin (AMOXIL) 250 MG/5ML suspension Take 10 mLs (500 mg total) by mouth 3 (three) times daily. 02/24/28   Jodi Marble, MD  amoxicillin (AMOXIL) 250 MG/5ML suspension 20 ml po tid x 3 days, then 10 ml po tid x 7 days. 05/11/83   Jodi Marble, MD  budesonide (ENTOCORT EC) 3 MG 24 hr capsule Take 3 capsules (9 mg total) by mouth daily. 9 mg daily for 2 weeks.  6 mg daily for 2 weeks.  3 mg daily for 2 weeks Patient not taking: Reported on 04/21/2016 12/09/15   Rogene Houston, MD  HYDROcodone-acetaminophen (HYCET) 7.5-325 mg/15 ml solution Take 10-20 mLs by mouth every 4 (four) hours as needed for moderate pain. 1/66/06   Jodi Marble, MD  PARoxetine (PAXIL) 20 MG tablet Take 20 mg by mouth daily. Reported on 04/21/2016    Historical Provider, MD   BP 153/82 mmHg  Pulse 106  Temp(Src) 98.4 F (36.9 C) (Oral)  Resp 16  SpO2 97% Physical Exam  Constitutional: He appears well-developed and well-nourished. No distress.  HENT:  Head: Normocephalic and atraumatic.  Right Ear: External ear normal.  Left Ear: External ear normal.  Trismus, difficulty opening his mouth more than a centimeter or 2, right sided peritonsillar erythema and edema  with uvula deviation towards the left  Eyes: Conjunctivae are normal. Right eye exhibits no discharge. Left eye exhibits no discharge. No scleral icterus.  Neck: Neck supple. No tracheal deviation present.  Cardiovascular: Normal rate.   Pulmonary/Chest: Effort normal. No stridor. No respiratory distress.  Musculoskeletal: He exhibits no edema.  Lymphadenopathy:    He has cervical adenopathy.  Neurological: He is alert. Cranial nerve deficit: no gross deficits.  Skin: Skin is warm and dry. No rash noted.  Psychiatric: He has a normal mood and affect.  Nursing note and vitals reviewed.   ED Course  Procedures (including critical care time) Labs Review Labs Reviewed  CBC WITH  DIFFERENTIAL/PLATELET - Abnormal; Notable for the following:    WBC 33.1 (*)    Neutro Abs 30.1 (*)    Monocytes Absolute 2.0 (*)    All other components within normal limits  BASIC METABOLIC PANEL - Abnormal; Notable for the following:    Sodium 133 (*)    Chloride 100 (*)    Glucose, Bld 107 (*)    All other components within normal limits    Medications  morphine 4 MG/ML injection 4 mg (4 mg Intravenous Given 04/21/16 1355)  lidocaine-EPINEPHrine (XYLOCAINE W/EPI) 2 %-1:100000 (with pres) injection 20 mL (not administered)  lidocaine-EPINEPHrine (XYLOCAINE W/EPI) 2 %-1:200000 (PF) injection 20 mL (not administered)  HYDROcodone-acetaminophen (HYCET) 7.5-325 mg/15 ml solution 10-20 mL (not administered)  amoxicillin (AMOXIL) 250 MG/5ML suspension 1,000 mg (not administered)    Followed by  amoxicillin (AMOXIL) 250 MG/5ML suspension 500 mg (not administered)  sodium chloride 0.9 % bolus 1,000 mL (1,000 mLs Intravenous New Bag/Given 04/21/16 1121)     MDM   Final diagnoses:  Peritonsillar abscess    Patient's exam is consistent with a peritonsillar abscess. I will consult with ENT for surgical aspiration.  2458 Dr Erik Obey will see the patient in the ED.   Equipment ordered for the procedure.  [place]  Leukocytosis noted associated with his infection.  Non toxic, afebrile in the ED.  1600  I&D procedure by Dr Erik Obey.  Pt tolerated well.  Stable for discharge  Dorie Rank, MD 04/21/16 1556

## 2016-04-21 NOTE — ED Notes (Signed)
Pt reports he woke up with  Sore throat 2 days ago and is now worse. Pt now  having difficulty speaking and oral swelling O2 sats97 % on RA . Pt A/O with no resp distress.

## 2016-04-21 NOTE — Consult Note (Signed)
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  No hx DM or immune compromise.  Sore throat/strep throat in childhood but nothing in 30 years.  No breathing issues.  PMH: Past Medical History  Diagnosis Date  . Anxiety   . Crohn's disease ([facility])     As a child he was dx.    Surg Hx: Past Surgical History  Procedure Laterality Date  . Colonoscopy N/A 12/09/2015    Procedure: COLONOSCOPY;  Surgeon: Aaron Houston, MD;  Location: [facility];  Service: Endoscopy;  Laterality: N/A;  230    FHx:   Family History  Problem Relation Age of Onset  . Hypertension Mother   . Hypertension Father   . Aaron Phillips Parkinson White syndrome Sister    SocHx:  reports that he has been smoking.  He uses smokeless tobacco. He reports that he drinks alcohol. His drug history is not on file.  ALLERGIES: No Known Allergies   (Not in a hospital admission)  Results for orders placed or performed during the hospital encounter of 04/21/16 (from the past 48 hour(s))  CBC with Differential     Status: Abnormal   Collection Time: 04/21/16 11:15 AM  Result Value Ref Range   WBC 33.1 (H) 4.0 - 10.5 K/uL    Comment: WHITE COUNT CONFIRMED ON SMEAR   RBC 4.48 4.22 - 5.81 MIL/uL   Hemoglobin 14.4 13.0 - 17.0 g/dL   HCT 42.3 39.0 - 52.0 %   MCV 94.4 78.0 - 100.0 fL   MCH 32.1 26.0 - 34.0 pg   MCHC 34.0 30.0 - 36.0 g/dL   RDW 13.3 11.5 - 15.5 %   Platelets 278 150 - 400 K/uL   Neutrophils Relative % 91 %   Lymphocytes Relative 3 %   Monocytes Relative 6 %   Eosinophils Relative 0 %   Basophils Relative 0 %   Neutro Abs 30.1 (H) 1.7 - 7.7 K/uL   Lymphs Abs 1.0 0.7 - 4.0 K/uL   Monocytes Absolute 2.0 (H) 0.1 - 1.0 K/uL   Eosinophils Absolute 0.0 0.0 - 0.7 K/uL   Basophils Absolute  0.0 0.0 - 0.1 K/uL   Smear Review MORPHOLOGY UNREMARKABLE   Basic metabolic panel     Status: Abnormal   Collection Time: 04/21/16 11:15 AM  Result Value Ref Range   Sodium 133 (L) 135 - 145 mmol/L   Potassium 4.0 3.5 - 5.1 mmol/L   Chloride 100 (L) 101 - 111 mmol/L   CO2 23 22 - 32 mmol/L   Glucose, Bld 107 (H) 65 - 99 mg/dL   BUN 7 6 - 20 mg/dL   Creatinine, Ser 0.90 0.61 - 1.24 mg/dL   Calcium 9.2 8.9 - 10.3 mg/dL   GFR calc non Af Amer >60 >60 mL/min   GFR calc Af Amer >60 >60 mL/min    Comment: (NOTE) The eGFR has been calculated using the CKD EPI equation. This calculation has not been validated in all clinical situations. eGFR's persistently <60 mL/min signify possible Chronic Kidney Disease.    Anion gap 10 5 - 15   No results found.    Blood pressure 124/76, pulse 94, temperature 98.4 F (36.9 C), temperature source Oral, resp. rate 16, SpO2 96 %.  PHYSICAL EXAM:  Overall appearance:  Distressed.  Warm to touch.  Halitosis. Trismus. Head:  NCAT Ears: clear Nose:  clear Oral Cavity: trismus.  Teeth in good repair.   Oral Pharynx/Hypopharynx/Larynx: swelling of RIGHT hemi soft palate with uvular edema and displacement to the LEFT.   Neuro: grossly intact Neck:  Tender mildly swollen RIGHT JDG nodal regions.     Assessment/Plan RIGHT peritonsillar abscess  With informed consent, anesthetized the pharynx with Cetacaine spray, then 1% xylocaine with 1:100,000 epinephrine, 10 ml total in stages.    Several minutes were allowed for this to take effect.  A 2 cm crescent incision above the RIGHT superior tonsil pole was sharply executed.  Pus was encountered deep behind the upper pole.  The opening was enlarged with a tonsil hemostat.  Hemostasis was spontaneous.  He tolerated the procedure fairly well.  Plan:  Po liquid amoxicillin and hydrocodone.  Recheck my office 2 weeks, sooner as needed.  Advance diet and activity as comfortable.   Aaron Phillips 8/89/1694, 3:05 PM

## 2016-04-21 NOTE — Progress Notes (Signed)
[place] A Deadmond is a 45 y.o. male who presents to Urgent Care today for severe sore throat. Patient is a several day history of worsening severe sore throat. He notes pain with swallowing and difficulty opening his mouth fully. He denies any trouble breathing. He's tried over-the-counter medicines that have not helped. He notes subjective fevers and chills.   Past Medical History  Diagnosis Date  . Anxiety   . Crohn's disease ([facility])     As a child he was dx.   Past Surgical History  Procedure Laterality Date  . Colonoscopy N/A 12/09/2015    Procedure: COLONOSCOPY;  Surgeon: Malissa Hippo, MD;  Location: [facility];  Service: Endoscopy;  Laterality: N/A;  230   Social History  Substance Use Topics  . Smoking status: Current Every Day Smoker  . Smokeless tobacco: Current User     Comment: Patient states that he uses dip occasional  . Alcohol Use: 0.0 oz/week    0 Standard drinks or equivalent per week   ROS as above: No headache, visual changes, nausea, vomiting, diarrhea, constipation, dizziness, abdominal pain, skin rash, fevers, chills, night sweats, weight loss,  body aches, joint swelling, muscle aches, chest pain, shortness of breath, mood changes, visual or auditory hallucinations.   Medications: Current Outpatient Prescriptions  Medication Sig Dispense Refill  . budesonide (ENTOCORT EC) 3 MG 24 hr capsule Take 3 capsules (9 mg total) by mouth daily. 9 mg daily for 2 weeks.  6 mg daily for 2 weeks.  3 mg daily for 2 weeks (Patient not taking: Reported on 04/21/2016) 84 capsule 0  . PARoxetine (PAXIL) 20 MG tablet Take 20 mg by mouth daily. Reported on 04/21/2016     No current facility-administered medications for this visit.   No Known Allergies   Exam:  BP 120/74 mmHg  Pulse 68  Temp(Src) 99 F (37.2 C) (Oral)  Resp 12  Ht 6\' 2"  (1.88 m)  Wt 219 lb (99.338 kg)  BMI 28.11 kg/m2  SpO2 97% Gen: Well NAD HEENT: EOMI,  MMM Swelling and midline shift with right  peritonsillar abscess. Trismus present. Tender swollen cervical lymphadenopathy right side compared to left. Lungs: Normal work of breathing. CTABL Heart: RRR no MRG Abd: NABS, Soft. Nondistended, Nontender Exts: Brisk capillary refill, warm and well perfused.   No results found for this or any previous visit (from the past 24 hour(s)). No results found.  Assessment and Plan: 45 y.o. male with right peritonsillar abscess versus retropharyngeal abscess. Transfer to ED for further evaluation and management.  Discussed warning signs or symptoms. Please see discharge instructions. Patient expresses understanding.

## 2016-04-21 NOTE — ED Notes (Signed)
Pt continues to self suction mouth.  Half and half Peroxide and water given to pt to gargle, per VO Dr. Lazarus Salines.

## 2016-04-21 NOTE — Discharge Instructions (Signed)
Amoxicillin liquid antibiotic Hydrocodone liquid for pain relief.  Tylenol or Ibuprofen would be fine also. Drink plenty of fluids, advance to solid when comfortable.   Return to work Monday Recheck my office 2 weeks. Call for problems or questions, [phone]

## 2016-04-21 NOTE — ED Notes (Signed)
ENT cart at bedside

## 2016-09-03 ENCOUNTER — Encounter (INDEPENDENT_AMBULATORY_CARE_PROVIDER_SITE_OTHER): Payer: Self-pay

## 2017-12-18 DIAGNOSIS — L0201 Cutaneous abscess of face: Secondary | ICD-10-CM | POA: Diagnosis not present

## 2017-12-19 ENCOUNTER — Emergency Department (HOSPITAL_COMMUNITY)
Admission: EM | Admit: 2017-12-19 | Discharge: 2017-12-19 | Disposition: A | Discharge status: Home / Self Care | Payer: 59 | Attending: Emergency Medicine | Admitting: Emergency Medicine

## 2017-12-19 ENCOUNTER — Encounter (HOSPITAL_COMMUNITY): Payer: Self-pay

## 2017-12-19 DIAGNOSIS — F17200 Nicotine dependence, unspecified, uncomplicated: Secondary | ICD-10-CM | POA: Insufficient documentation

## 2017-12-19 DIAGNOSIS — L0201 Cutaneous abscess of face: Secondary | ICD-10-CM | POA: Diagnosis not present

## 2017-12-19 NOTE — ED Triage Notes (Signed)
Pt reports abscess to left side of face x 4 days.  Was seen at urgent care yesterday and started on doxycycline.  Pt says is no better.

## 2017-12-19 NOTE — Discharge Instructions (Signed)
Continue the doxycycline.  Would expect improvement to start over the next 7 days.  Return for any new or worse symptoms.  Work note provided if needed.

## 2017-12-19 NOTE — ED Provider Notes (Signed)
[facility] EMERGENCY DEPARTMENT Provider Note   CSN: [id number] Arrival date & time: 12/19/17  1727     History   Chief Complaint Chief Complaint  Patient presents with  . Abscess    HPI Aaron Phillips is a 46 y.o. male.  Patient with abscess infection that developed to the left side of the face 4 days ago.  Seen in urgent care yesterday and started on doxycycline.  Patient here because it is no better.  Also states is not any worse.  Denies fevers.  No neck stiffness.  No trouble swallowing.      Past Medical History:  Diagnosis Date  . Anxiety   . Crohn's disease ([place])    As a child he was dx.    Patient Active Problem List   Diagnosis Date Noted  . Depression 11/24/2015  . Chronic diarrhea 11/24/2015  . History of Crohn's disease 11/24/2015    Past Surgical History:  Procedure Laterality Date  . COLONOSCOPY N/A 12/09/2015   Procedure: COLONOSCOPY;  Surgeon: Rogene Houston, MD;  [facility];  Service: Endoscopy;  Laterality: N/A;  230       Home Medications    Prior to Admission medications   Medication Sig Start Date End Date Taking? Authorizing Provider  amoxicillin (AMOXIL) 250 MG/5ML suspension Take 5 mLs (250 mg total) by mouth every 8 (eight) hours. 8/33/82   Jodi Marble, MD  amoxicillin (AMOXIL) 250 MG/5ML suspension Take 20 mLs (1,000 mg total) by mouth 3 (three) times daily. 04/27/38   Jodi Marble, MD  amoxicillin (AMOXIL) 250 MG/5ML suspension Take 10 mLs (500 mg total) by mouth 3 (three) times daily. 06/28/72   Jodi Marble, MD  amoxicillin (AMOXIL) 250 MG/5ML suspension 20 ml po tid x 3 days, then 10 ml po tid x 7 days. 04/11/36   Jodi Marble, MD  budesonide (ENTOCORT EC) 3 MG 24 hr capsule Take 3 capsules (9 mg total) by mouth daily. 9 mg daily for 2 weeks.  6 mg daily for 2 weeks.  3 mg daily for 2 weeks Patient not taking: Reported on 04/21/2016 12/09/15   Rogene Houston, MD  HYDROcodone-acetaminophen (HYCET)  7.5-325 mg/15 ml solution Take 10-20 mLs by mouth every 4 (four) hours as needed for moderate pain. 08/26/39   Jodi Marble, MD  PARoxetine (PAXIL) 20 MG tablet Take 20 mg by mouth daily. Reported on 04/21/2016    [provider]    Family History Family History  Problem Relation Age of Onset  . Hypertension Mother   . Hypertension Father   . Yves Dill Parkinson White syndrome Sister     Social History Social History   Tobacco Use  . Smoking status: Current Every Day Smoker  . Smokeless tobacco: Current User  . Tobacco comment: Patient states that he uses dip occasional  Substance Use Topics  . Alcohol use: Yes    Alcohol/week: 0.0 oz  . Drug use: No     Allergies   Patient has no known allergies.   Review of Systems Review of Systems  Constitutional: Negative for fever.  HENT: Positive for facial swelling. Negative for trouble swallowing.   Eyes: Negative for pain and redness.  Respiratory: Negative for shortness of breath.   Cardiovascular: Negative for chest pain.  Gastrointestinal: Negative for abdominal pain.  Musculoskeletal: Negative for neck stiffness.  Neurological: Negative for syncope.  Hematological: Does not bruise/bleed easily.  Psychiatric/Behavioral: Negative for confusion.     Physical Exam Updated Vital  Signs BP 132/87 (BP Location: Left Arm)   Pulse 77   Temp 98.1 F (36.7 C) (Oral)   Resp 20   Ht 1.88 m (6' 2" )   Wt 102.1 kg (225 lb)   SpO2 100%   BMI 28.89 kg/m   Physical Exam  Constitutional: He is oriented to person, place, and time. He appears well-developed and well-nourished. No distress.  HENT:  Head: Normocephalic and atraumatic.  Mouth/Throat: Oropharynx is clear and moist.  Left side of face pupil area with area of induration measuring about 5 cm no area of fluctuance.  Small area of erythema measuring about 2 cm with a scab in the area.  No purulent discharge.  No cervical adenopathy.  Eyes: Conjunctivae and EOM are  normal. Pupils are equal, round, and reactive to light.  Neck: Normal range of motion. Neck supple.  Cardiovascular: Normal rate, regular rhythm and normal heart sounds.  Pulmonary/Chest: Effort normal and breath sounds normal. No respiratory distress.  Abdominal: Soft. Bowel sounds are normal. There is no tenderness.  Lymphadenopathy:    He has no cervical adenopathy.  Neurological: He is alert and oriented to person, place, and time. No cranial nerve deficit or sensory deficit. He exhibits normal muscle tone. Coordination normal.  Skin: Skin is warm.  Nursing note and vitals reviewed.    ED Treatments / Results  Labs (all labs ordered are listed, but only abnormal results are displayed) Labs Reviewed - No data to display  EKG  EKG Interpretation None       Radiology No results found.  Procedures Procedures (including critical care time)  Medications Ordered in ED Medications - No data to display   Initial Impression / Assessment and Plan / ED Course  I have reviewed the triage vital signs and the nursing notes.  Pertinent labs & imaging results that were available during my care of the patient were reviewed by me and considered in my medical decision making (see chart for details).    Facial abscess but mostly induration.  No significant area of fluctuance.  In a very vascular part of the face.  Should respond to doxycycline over the next several days.  Was given a 7-day course.  Recommend that he continue that.  Work note provided.  Patient will return for any new or worse symptoms.  Do not recommend I&D at this time.   Final Clinical Impressions(s) / ED Diagnoses   Final diagnoses:  Cutaneous abscess of face    ED Discharge Orders    None       Fredia Sorrow, MD 12/19/17 1816

## 2018-08-19 ENCOUNTER — Encounter (INDEPENDENT_AMBULATORY_CARE_PROVIDER_SITE_OTHER): Payer: Self-pay | Admitting: Internal Medicine

## 2018-08-19 ENCOUNTER — Ambulatory Visit (INDEPENDENT_AMBULATORY_CARE_PROVIDER_SITE_OTHER): Payer: 59 | Admitting: Internal Medicine

## 2018-08-19 VITALS — BP 142/100 | HR 64 | Temp 97.3°F | Ht 74.0 in | Wt 234.0 lb

## 2018-08-19 DIAGNOSIS — K219 Gastro-esophageal reflux disease without esophagitis: Secondary | ICD-10-CM | POA: Diagnosis not present

## 2018-08-19 DIAGNOSIS — K5010 Crohn's disease of large intestine without complications: Secondary | ICD-10-CM | POA: Diagnosis not present

## 2018-08-19 MED ORDER — PANTOPRAZOLE SODIUM 40 MG PO TBEC: 40.0000 mg | DELAYED_RELEASE_TABLET | Freq: Every day | ORAL | 3 refills | Status: DC

## 2018-08-19 NOTE — Patient Instructions (Signed)
Labs today

## 2018-08-19 NOTE — Progress Notes (Signed)
   Subjective:    Patient ID: Aaron Phillips, male    DOB: [birth year], 47 y.o.   MRN: [id number]  HPI Here today for f/u. Colonoscopy 12/09/2015 revealed mild active Crohn's disease, erosions at cecum and ascending, proctitis, patchy colitis. Small external hemorrhoids.  Biopsy: Clinical impresson of mildly active Crohn's. He was started on Budesonide 9 mg x 2 weeks,6 mg x 2 weeks. 3 mg x 2 weeks. Patient did not follow up. States has had Crohn's x 30 yrs. He tells me today he says he has not felt good. Feels weak, nauseated. Has heartburn.  No energy. Usually has a BM which is watery more frequently in the past 6 months. Sometimes he is constipated.  Some gas pain occasionally.  No recent antibiotics. Appetite is up and down. No weight loss.    Review of Systems Past Medical History:  Diagnosis Date  . Anxiety   . Crohn's disease ([facility])    As a child he was dx.    Past Surgical History:  Procedure Laterality Date  . COLONOSCOPY N/A 12/09/2015   Procedure: COLONOSCOPY;  Surgeon: Malissa Hippo, MD;  [facility];  Service: Endoscopy;  Laterality: N/A;  230    No Known Allergies  No current outpatient medications on file prior to visit.   No current facility-administered medications on file prior to visit.         Objective:   Physical Exam Blood pressure (!) 142/100, pulse 64, temperature (!) 97.3 F (36.3 C), height 6\' 2"  (1.88 m), weight 234 lb (106.1 kg).  Alert and oriented. Skin warm and dry. Oral mucosa is moist.   . Sclera anicteric, conjunctivae is pink. Thyroid not enlarged. No cervical lymphadenopathy. Lungs clear. Heart regular rate and rhythm.  Abdomen is soft. Bowel sounds are positive. No hepatomegaly. No abdominal masses felt. No tenderness.  No edema to lower extremities.          Assessment & Plan:  GERD. Rx for Protonix sent to his pharmacy. Samples of Dexilant x 5 boxes given to patient. Crohn's: CBC and CRP. If normal. Fecal  calprotein If CRP elevated: Budesomide 9 mg x 2 weeks, 6 mg x 2 weeks, 3 mg x 2 weeks. Pentasa 500mg  (1000mg  TID). I discussed with Dr. Karilyn Cota.

## 2018-08-20 ENCOUNTER — Telehealth (INDEPENDENT_AMBULATORY_CARE_PROVIDER_SITE_OTHER): Payer: Self-pay | Admitting: Internal Medicine

## 2018-08-20 DIAGNOSIS — K5010 Crohn's disease of large intestine without complications: Secondary | ICD-10-CM

## 2018-08-20 LAB — CBC WITH DIFFERENTIAL/PLATELET
BASOS PCT: 0.7 %
Basophils Absolute: 141 cells/uL (ref 0–200)
EOS ABS: 404 {cells}/uL (ref 15–500)
EOS PCT: 2 %
HCT: 46.8 % (ref 38.5–50.0)
HEMOGLOBIN: 16.2 g/dL (ref 13.2–17.1)
Lymphs Abs: 2444 cells/uL (ref 850–3900)
MCH: 31.4 pg (ref 27.0–33.0)
MCHC: 34.6 g/dL (ref 32.0–36.0)
MCV: 90.7 fL (ref 80.0–100.0)
MPV: 10.5 fL (ref 7.5–12.5)
Monocytes Relative: 4 %
NEUTROS ABS: 16402 {cells}/uL — AB (ref 1500–7800)
Neutrophils Relative %: 81.2 %
Platelets: 311 10*3/uL (ref 140–400)
RBC: 5.16 10*6/uL (ref 4.20–5.80)
RDW: 12.6 % (ref 11.0–15.0)
Total Lymphocyte: 12.1 %
WBC mixed population: 808 cells/uL (ref 200–950)
WBC: 20.2 10*3/uL — ABNORMAL HIGH (ref 3.8–10.8)

## 2018-08-20 LAB — C-REACTIVE PROTEIN: CRP: 23.1 mg/L — ABNORMAL HIGH (ref <8.0)

## 2018-08-20 NOTE — Telephone Encounter (Signed)
Lab ordered.

## 2018-08-20 NOTE — Telephone Encounter (Signed)
Results given to patient. WBC and Abs neutrophils elevated. CRP elevated. Waiting on calprotectin

## 2018-08-22 ENCOUNTER — Telehealth (INDEPENDENT_AMBULATORY_CARE_PROVIDER_SITE_OTHER): Payer: Self-pay | Admitting: Internal Medicine

## 2018-08-22 NOTE — Telephone Encounter (Signed)
Patient came by office to pick up las - wanted you to know he is taking an antibiotic for a sinus infection - patient stated it may interfere with the stool sample you wanted.

## 2018-08-26 NOTE — Telephone Encounter (Signed)
noted 

## 2018-09-29 DIAGNOSIS — H1032 Unspecified acute conjunctivitis, left eye: Secondary | ICD-10-CM | POA: Diagnosis not present

## 2018-11-12 ENCOUNTER — Other Ambulatory Visit (HOSPITAL_COMMUNITY)
Admission: RE | Admit: 2018-11-12 | Discharge: 2018-11-12 | Disposition: A | Discharge status: Home / Self Care | Payer: 59 | Source: Ambulatory Visit | Attending: Internal Medicine | Admitting: Internal Medicine

## 2018-11-12 ENCOUNTER — Ambulatory Visit (INDEPENDENT_AMBULATORY_CARE_PROVIDER_SITE_OTHER): Payer: 59 | Admitting: Internal Medicine

## 2018-11-12 ENCOUNTER — Telehealth (INDEPENDENT_AMBULATORY_CARE_PROVIDER_SITE_OTHER): Payer: Self-pay | Admitting: Internal Medicine

## 2018-11-12 ENCOUNTER — Encounter (INDEPENDENT_AMBULATORY_CARE_PROVIDER_SITE_OTHER): Payer: Self-pay | Admitting: Internal Medicine

## 2018-11-12 VITALS — BP 120/70 | HR 60 | Temp 97.7°F | Ht 74.0 in | Wt 243.2 lb

## 2018-11-12 DIAGNOSIS — K5090 Crohn's disease, unspecified, without complications: Secondary | ICD-10-CM | POA: Diagnosis not present

## 2018-11-12 LAB — C-REACTIVE PROTEIN: CRP: 1.4 mg/dL — ABNORMAL HIGH (ref <1.0)

## 2018-11-12 NOTE — Telephone Encounter (Signed)
Needs OV in 4 weeks. 

## 2018-11-12 NOTE — Progress Notes (Signed)
   Subjective:    Patient ID: Aaron Phillips, male    DOB: [birth year], 47 y.o.   MRN: [id number]  HPI Here today for f/u.  Last seen in August of this year. Hx of Crohn's disease. Colonoscopy 12/19/2015   revealed mild active Crohn's disease, erosions at cecum and ascending, proctitis, patchy colitis. Small external hemorrhoids.  BMs are loose. 4-5 stools days. Stools are not watery. Appetite is good. Gained weight 10 pounds.  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  He denies any abdominal pain . He does however say he really does not feel good. Recently had lab work at work and his WBC was elevated. Has been consistently elevated for a while.       Review of Systems Past Medical History:  Diagnosis Date  . Anxiety   . Crohn's disease (HCC)    As a child he was dx.    Past Surgical History:  Procedure Laterality Date  . COLONOSCOPY N/A 12/09/2015   Procedure: COLONOSCOPY;  Surgeon: Malissa Hippo, MD;  [facility];  Service: Endoscopy;  Laterality: N/A;  230    No Known Allergies  Current Outpatient Medications on File Prior to Visit  Medication Sig Dispense Refill  . pantoprazole (PROTONIX) 40 MG tablet Take 1 tablet (40 mg total) by mouth daily. 90 tablet 3   No current facility-administered medications on file prior to visit.         Objective:   Physical Exam Blood pressure 120/70, pulse 60, temperature 97.7 F (36.5 C), height 6\' 2"  (1.88 m), weight 243 lb 3.2 oz (110.3 kg). Alert and oriented. Skin warm and dry. Oral mucosa is moist.   . Sclera anicteric, conjunctivae is pink. Thyroid not enlarged. No cervical lymphadenopathy. Lungs clear. Heart regular rate and rhythm.  Abdomen is soft. Bowel sounds are positive. No hepatomegaly. No abdominal masses felt. No tenderness.  No edema to lower extremities.          Assessment & Plan:  Crohn's. Am going to repeat a CBC and CRP. I discussed with Dr. Karilyn Cota.  Entocort 9mg  x 2 weeks, then 6mg   x 2 weeks, then 3 mgs x 2 weeks. Pentasa 1gm qid. OV in 4 weeks.

## 2018-11-12 NOTE — Patient Instructions (Signed)
Labs today

## 2018-11-17 ENCOUNTER — Telehealth (INDEPENDENT_AMBULATORY_CARE_PROVIDER_SITE_OTHER): Payer: Self-pay | Admitting: Internal Medicine

## 2018-11-17 NOTE — Telephone Encounter (Signed)
Have u received a prior authorization on Aaron Phillips for Pentasa.

## 2018-11-17 NOTE — Telephone Encounter (Signed)
err

## 2018-11-17 NOTE — Telephone Encounter (Signed)
k

## 2018-11-17 NOTE — Telephone Encounter (Signed)
It just came across fax machine.

## 2018-11-18 ENCOUNTER — Telehealth (INDEPENDENT_AMBULATORY_CARE_PROVIDER_SITE_OTHER): Payer: Self-pay | Admitting: Internal Medicine

## 2018-11-18 DIAGNOSIS — K5090 Crohn's disease, unspecified, without complications: Secondary | ICD-10-CM

## 2018-11-18 MED ORDER — BALSALAZIDE DISODIUM 750 MG PO CAPS
2250.0000 mg | ORAL_CAPSULE | Freq: Three times a day (TID) | ORAL | 3 refills | Status: DC
Start: 2018-11-18 — End: 2020-03-08

## 2018-11-18 NOTE — Telephone Encounter (Signed)
Rx sent to his pharmacy

## 2018-11-18 NOTE — Telephone Encounter (Signed)
OV in 4 months 

## 2018-11-19 ENCOUNTER — Other Ambulatory Visit (INDEPENDENT_AMBULATORY_CARE_PROVIDER_SITE_OTHER): Payer: Self-pay | Admitting: *Deleted

## 2018-11-19 DIAGNOSIS — K5090 Crohn's disease, unspecified, without complications: Secondary | ICD-10-CM

## 2018-12-08 DIAGNOSIS — Z6829 Body mass index (BMI) 29.0-29.9, adult: Secondary | ICD-10-CM | POA: Diagnosis not present

## 2018-12-08 DIAGNOSIS — Z0000 Encounter for general adult medical examination without abnormal findings: Secondary | ICD-10-CM | POA: Diagnosis not present

## 2018-12-08 DIAGNOSIS — Z719 Counseling, unspecified: Secondary | ICD-10-CM | POA: Diagnosis not present

## 2018-12-08 DIAGNOSIS — Z1389 Encounter for screening for other disorder: Secondary | ICD-10-CM | POA: Diagnosis not present

## 2018-12-08 DIAGNOSIS — Z720 Tobacco use: Secondary | ICD-10-CM | POA: Diagnosis not present

## 2018-12-08 DIAGNOSIS — E663 Overweight: Secondary | ICD-10-CM | POA: Diagnosis not present

## 2018-12-08 DIAGNOSIS — K5000 Crohn's disease of small intestine without complications: Secondary | ICD-10-CM | POA: Diagnosis not present

## 2018-12-11 ENCOUNTER — Ambulatory Visit (INDEPENDENT_AMBULATORY_CARE_PROVIDER_SITE_OTHER): Payer: Self-pay | Admitting: Internal Medicine

## 2019-01-19 ENCOUNTER — Other Ambulatory Visit (INDEPENDENT_AMBULATORY_CARE_PROVIDER_SITE_OTHER): Payer: Self-pay | Admitting: *Deleted

## 2019-01-19 ENCOUNTER — Encounter (INDEPENDENT_AMBULATORY_CARE_PROVIDER_SITE_OTHER): Payer: Self-pay | Admitting: *Deleted

## 2019-01-19 DIAGNOSIS — K5090 Crohn's disease, unspecified, without complications: Secondary | ICD-10-CM

## 2019-02-19 ENCOUNTER — Telehealth (INDEPENDENT_AMBULATORY_CARE_PROVIDER_SITE_OTHER): Payer: Self-pay | Admitting: Internal Medicine

## 2019-02-19 NOTE — Telephone Encounter (Signed)
Patient left message stating his medication is not working - please call him at [phone]

## 2019-02-25 DIAGNOSIS — K5090 Crohn's disease, unspecified, without complications: Secondary | ICD-10-CM | POA: Diagnosis not present

## 2019-02-25 NOTE — Telephone Encounter (Signed)
I have spoken with patient. He is going to call his insurance company and find out what other medication are covered under his policy.

## 2019-02-25 NOTE — Telephone Encounter (Signed)
Patient came by office - needs to talk to you about changing his medication ph# [phone]

## 2019-02-26 LAB — CBC
HEMATOCRIT: 43.4 % (ref 38.5–50.0)
HEMOGLOBIN: 15.2 g/dL (ref 13.2–17.1)
MCH: 31.5 pg (ref 27.0–33.0)
MCHC: 35 g/dL (ref 32.0–36.0)
MCV: 89.9 fL (ref 80.0–100.0)
MPV: 10.5 fL (ref 7.5–12.5)
Platelets: 346 10*3/uL (ref 140–400)
RBC: 4.83 10*6/uL (ref 4.20–5.80)
RDW: 12.5 % (ref 11.0–15.0)
WBC: 13.6 10*3/uL — AB (ref 3.8–10.8)

## 2019-02-26 LAB — C-REACTIVE PROTEIN: CRP: 12 mg/L — ABNORMAL HIGH (ref <8.0)

## 2019-03-19 ENCOUNTER — Ambulatory Visit (INDEPENDENT_AMBULATORY_CARE_PROVIDER_SITE_OTHER): Payer: Self-pay | Admitting: Internal Medicine

## 2019-03-20 ENCOUNTER — Ambulatory Visit (INDEPENDENT_AMBULATORY_CARE_PROVIDER_SITE_OTHER): Payer: 59 | Admitting: Internal Medicine

## 2019-03-20 ENCOUNTER — Encounter (INDEPENDENT_AMBULATORY_CARE_PROVIDER_SITE_OTHER): Payer: Self-pay | Admitting: Internal Medicine

## 2019-03-20 VITALS — Ht 74.0 in | Wt 243.0 lb

## 2019-03-20 DIAGNOSIS — K5010 Crohn's disease of large intestine without complications: Secondary | ICD-10-CM

## 2019-03-20 NOTE — Progress Notes (Addendum)
   Subjective:    Patient ID: Aaron Phillips, male    DOB: 10-18-1971, 48 y.o.   MRN: [id number]  Patient requested to speak to me. Unable to do video OV.Telephone OV due to COVID-10/  HPI This is a telephone visit. Aaron Phillips was at home. Time was 915am-930. Total time 15 minutes.  Aaron Phillips has a hx of Crohn's disease. Underwent a colonoscopy in December of 2016 which revealed:  Impression:   ileocolonic Crohn's disease which appears to be mild based on endoscopic findings. Patchy scarring noted throughout the colon indicated of more active disease in the past. Biopsies taken from ileal and cecal mucosa.  Two diverticula at hepatic flexure.  Small external hemorrhoids. He tells me he is doing good. Appetite is good. No weight loss. He has a BM daily. No melena or BRRB. States his weight is 243. He continues to work full time the [employer].     Review of Systems     Past Medical History:  Diagnosis Date  . Anxiety   . Crohn's disease ([place])    As a child he was dx.    Past Surgical History:  Procedure Laterality Date  . COLONOSCOPY N/A 12/09/2015   Procedure: COLONOSCOPY;  Surgeon: Rogene Houston, MD;  [facility];  Service: Endoscopy;  Laterality: N/A;  230    No Known Allergies  Current Outpatient Medications on File Prior to Visit  Medication Sig Dispense Refill  . balsalazide (COLAZAL) 750 MG capsule Take 3 capsules (2,250 mg total) by mouth 3 (three) times daily. 270 capsule 3  . pantoprazole (PROTONIX) 40 MG tablet Take 1 tablet (40 mg total) by mouth daily. 90 tablet 3   No current facility-administered medications on file prior to visit.         Objective:   Physical Exam  .deferred.        Assessment & Plan:  Crohn's disease. Presently taking Coliza 3 tabs three times a day. Admits to being not complaint with this medication. He is interested in starting another Crohn's medications.Will see back in 6 months.  I spent 12 minutes talking  with patient.

## 2019-05-13 ENCOUNTER — Other Ambulatory Visit: Payer: 59

## 2019-07-25 ENCOUNTER — Other Ambulatory Visit (INDEPENDENT_AMBULATORY_CARE_PROVIDER_SITE_OTHER): Payer: Self-pay | Admitting: Internal Medicine

## 2019-07-25 DIAGNOSIS — K219 Gastro-esophageal reflux disease without esophagitis: Secondary | ICD-10-CM

## 2020-02-22 ENCOUNTER — Other Ambulatory Visit: Payer: Self-pay

## 2020-02-22 ENCOUNTER — Telehealth (HOSPITAL_COMMUNITY): Payer: Self-pay

## 2020-02-22 ENCOUNTER — Inpatient Hospital Stay (HOSPITAL_COMMUNITY): Payer: 59 | Attending: Hematology | Admitting: Hematology

## 2020-02-22 ENCOUNTER — Encounter (HOSPITAL_COMMUNITY): Payer: Self-pay | Admitting: Hematology

## 2020-02-22 ENCOUNTER — Inpatient Hospital Stay (HOSPITAL_COMMUNITY): Payer: 59

## 2020-02-22 VITALS — BP 114/82 | HR 78 | Temp 97.1°F | Resp 18 | Ht 74.0 in | Wt 236.0 lb

## 2020-02-22 DIAGNOSIS — D72829 Elevated white blood cell count, unspecified: Secondary | ICD-10-CM | POA: Insufficient documentation

## 2020-02-22 DIAGNOSIS — Z809 Family history of malignant neoplasm, unspecified: Secondary | ICD-10-CM

## 2020-02-22 DIAGNOSIS — D72825 Bandemia: Secondary | ICD-10-CM

## 2020-02-22 DIAGNOSIS — F17210 Nicotine dependence, cigarettes, uncomplicated: Secondary | ICD-10-CM | POA: Diagnosis not present

## 2020-02-22 DIAGNOSIS — K5090 Crohn's disease, unspecified, without complications: Secondary | ICD-10-CM

## 2020-02-22 DIAGNOSIS — R197 Diarrhea, unspecified: Secondary | ICD-10-CM

## 2020-02-22 LAB — CBC WITH DIFFERENTIAL/PLATELET
Abs Immature Granulocytes: 0.04 10*3/uL (ref 0.00–0.07)
Basophils Absolute: 0.1 10*3/uL (ref 0.0–0.1)
Basophils Relative: 1 %
Eosinophils Absolute: 0.3 10*3/uL (ref 0.0–0.5)
Eosinophils Relative: 2 %
HCT: 45.5 % (ref 39.0–52.0)
Hemoglobin: 15.3 g/dL (ref 13.0–17.0)
Immature Granulocytes: 0 %
Lymphocytes Relative: 22 %
Lymphs Abs: 2.7 10*3/uL (ref 0.7–4.0)
MCH: 30.7 pg (ref 26.0–34.0)
MCHC: 33.6 g/dL (ref 30.0–36.0)
MCV: 91.2 fL (ref 80.0–100.0)
Monocytes Absolute: 0.6 10*3/uL (ref 0.1–1.0)
Monocytes Relative: 5 %
Neutro Abs: 8.9 10*3/uL — ABNORMAL HIGH (ref 1.7–7.7)
Neutrophils Relative %: 70 %
Platelets: 329 10*3/uL (ref 150–400)
RBC: 4.99 MIL/uL (ref 4.22–5.81)
RDW: 13.2 % (ref 11.5–15.5)
WBC: 12.7 10*3/uL — ABNORMAL HIGH (ref 4.0–10.5)
nRBC: 0 % (ref 0.0–0.2)

## 2020-02-22 LAB — SEDIMENTATION RATE: Sed Rate: 18 mm/hr — ABNORMAL HIGH (ref 0–16)

## 2020-02-22 LAB — CARBOXYHEMOGLOBIN - COOX: Carboxyhemoglobin: 8.1 % (ref 0.5–1.5)

## 2020-02-22 LAB — LACTATE DEHYDROGENASE: LDH: 121 U/L (ref 98–192)

## 2020-02-22 LAB — C-REACTIVE PROTEIN: CRP: 1.5 mg/dL — ABNORMAL HIGH (ref <1.0)

## 2020-02-22 NOTE — Telephone Encounter (Signed)
..  CRITICAL VALUE ALERT Critical value received: Carboxyhemoglobin Date of notification:  02/22/2020 Time of notification: 02:25 pm  Critical value read back:   Nurse who received alert:  Reino Kent, LPN  MD notified time and response:  Dr Delton Coombes notified at 2:40pm

## 2020-02-22 NOTE — Patient Instructions (Signed)
[place] at [facility] Discharge Instructions  You were seen today by Dr. Delton Coombes. He went over your history, family history and how you've been feeling lately. You will have blood work done before you leave today. He will see you back in 3 weeks for follow up.   Thank you for choosing [place] at [facility] to provide your oncology and hematology care.  To afford each patient quality time with our provider, please arrive at least 15 minutes before your scheduled appointment time.   If you have a lab appointment with the [facility] please come in thru the  Main Entrance and check in at the main information desk  You need to re-schedule your appointment should you arrive 10 or more minutes late.  We strive to give you quality time with our providers, and arriving late affects you and other patients whose appointments are after yours.  Also, if you no show three or more times for appointments you may be dismissed from the clinic at the providers discretion.     Again, thank you for choosing [facility].  Our hope is that these requests will decrease the amount of time that you wait before being seen by our physicians.       _____________________________________________________________  Should you have questions after your visit to [facility], please contact our office at [phone] between the hours of 8:00 a.m. and 4:30 p.m.  Voicemails left after 4:00 p.m. will not be returned until the following business day.  For prescription refill requests, have your pharmacy contact our office and allow 72 hours.    Cancer Center Support Programs:   > Cancer Support Group  2nd Tuesday of the month 1pm-2pm, Journey Room

## 2020-02-22 NOTE — Progress Notes (Signed)
CONSULT NOTE  Patient Care Team: Redmond School, MD as PCP - General (Internal Medicine)  CHIEF COMPLAINTS/PURPOSE OF CONSULTATION:  Leukocytosis.  HISTORY OF PRESENTING ILLNESS:  Aaron Phillips 49 y.o. male is seen at the request of Dr. Gerarda Fraction for elevated white count.  CBC on 01/21/2020 shows white count of 13.7 with differential showing 74% neutrophils, 19% lymphocytes, 4% monocytes, 2% eosinophils.  Absolute neutrophil count was elevated at 10,000.  Hemoglobin and platelet count were normal.  LFTs were also normal.  He denies any history of systemic steroids.  No topical steroid use.  No prior history of splenectomy.  Denies any fevers, night sweats or weight loss in the last 6 months.  Denies any infections requiring antibiotics in the last 1 year.  No skin rashes.  No joint swellings reported.  He was diagnosed with Crohn's disease with the major symptom being diarrhea.  Denies any blood in the stools.  He was started on balsalazide 3 capsules 3 times a day.  However he is not taking it on a regular basis.  Last time he took it was a month ago.  He works as a Airline pilot.  Denies any major chemical exposure.  No family history of acute leukemia.  Paternal grandmother had cancer.  Patient does not know the type.  Maternal grandfather died of lung cancer.  Appetite and energy levels are reported as 100%.  No new pains reported.  MEDICAL HISTORY:  Past Medical History:  Diagnosis Date  . Anxiety   . Crohn's disease ([place])    As a child he was dx.  . Crohn's disease ([place])     SURGICAL HISTORY: Past Surgical History:  Procedure Laterality Date  . COLONOSCOPY N/A 12/09/2015   Procedure: COLONOSCOPY;  Surgeon: Rogene Houston, MD;  [facility];  Service: Endoscopy;  Laterality: N/A;  230  . INGUINAL HERNIA REPAIR  2017    SOCIAL HISTORY: Social History   Socioeconomic History  . Marital status: Divorced    Spouse name: Not on file  . Number of children: 1  . Years  of education: Not on file  . Highest education level: Not on file  Occupational History  . Not on file  Tobacco Use  . Smoking status: Current Every Day Smoker  . Smokeless tobacco: Current User  . Tobacco comment: Patient states that he uses dip occasional  Substance and Sexual Activity  . Alcohol use: Yes    Alcohol/week: 0.0 standard drinks  . Drug use: No  . Sexual activity: Not on file  Other Topics Concern  . Not on file  Social History Narrative  . Not on file   Social Determinants of Health   Financial Resource Strain:   . Difficulty of Paying Living Expenses: Not on file  Food Insecurity:   . Worried About Charity fundraiser in the Last Year: Not on file  . Ran Out of Food in the Last Year: Not on file  Transportation Needs:   . Lack of Transportation (Medical): Not on file  . Lack of Transportation (Non-Medical): Not on file  Physical Activity:   . Days of Exercise per Week: Not on file  . Minutes of Exercise per Session: Not on file  Stress:   . Feeling of Stress : Not on file  Social Connections:   . Frequency of Communication with Friends and Family: Not on file  . Frequency of Social Gatherings with Friends and Family: Not on file  . Attends  Religious Services: Not on file  . Active Member of Clubs or Organizations: Not on file  . Attends Archivist Meetings: Not on file  . Marital Status: Not on file  Intimate Partner Violence:   . Fear of Current or Ex-Partner: Not on file  . Emotionally Abused: Not on file  . Physically Abused: Not on file  . Sexually Abused: Not on file    FAMILY HISTORY: Family History  Problem Relation Age of Onset  . Hypertension Mother   . Hypertension Father   . Yves Dill Parkinson White syndrome Sister     ALLERGIES:  has No Known Allergies.  MEDICATIONS:  Current Outpatient Medications  Medication Sig Dispense Refill  . PARoxetine (PAXIL) 20 MG tablet Take 20 mg by mouth daily.    . balsalazide (COLAZAL) 750  MG capsule Take 3 capsules (2,250 mg total) by mouth 3 (three) times daily. (Patient not taking: Reported on 02/22/2020) 270 capsule 3  . pantoprazole (PROTONIX) 40 MG tablet TAKE 1 TABLET(40 MG) BY MOUTH DAILY (Patient not taking: Reported on 02/22/2020) 90 tablet 3  . valACYclovir (VALTREX) 500 MG tablet Take 500 mg by mouth as needed.      No current facility-administered medications for this visit.    REVIEW OF SYSTEMS:   Constitutional: Denies fevers, chills or abnormal night sweats Eyes: Denies blurriness of vision, double vision or watery eyes Ears, nose, mouth, throat, and face: Denies mucositis or sore throat Respiratory: Denies cough, dyspnea or wheezes Cardiovascular: Denies palpitation, chest discomfort or lower extremity swelling Gastrointestinal: Positive for intermittent diarrhea.  Denies any nausea or vomiting. Skin: Denies abnormal skin rashes Lymphatics: Denies new lymphadenopathy or easy bruising Neurological:Denies numbness, tingling or new weaknesses Behavioral/Psych: Mood is stable, no new changes  All other systems were reviewed with the patient and are negative.  PHYSICAL EXAMINATION: ECOG PERFORMANCE STATUS: 0 - Asymptomatic  Vitals:   02/22/20 1322  BP: 114/82  Pulse: 78  Resp: 18  Temp: (!) 97.1 F (36.2 C)  SpO2: 98%   Filed Weights   02/22/20 1322  Weight: 236 lb (107 kg)    GENERAL:alert, no distress and comfortable SKIN: skin color, texture, turgor are normal, no rashes or significant lesions EYES: normal, conjunctiva are pink and non-injected, sclera clear OROPHARYNX:no exudate, no erythema and lips, buccal mucosa, and tongue normal  NECK: supple, thyroid normal size, non-tender, without nodularity LYMPH:  no palpable lymphadenopathy in the cervical, axillary or inguinal LUNGS: clear to auscultation and percussion with normal breathing effort HEART: regular rate & rhythm and no murmurs and no lower extremity edema ABDOMEN:abdomen soft,  non-tender and normal bowel sounds Musculoskeletal:no cyanosis of digits and no clubbing  PSYCH: alert & oriented x 3 with fluent speech NEURO: no focal motor/sensory deficits  LABORATORY DATA:  I have reviewed the data as listed No results found for this or any previous visit (from the past 2160 hour(s)).  RADIOGRAPHIC STUDIES: I have personally reviewed the radiological images as listed and agreed with the findings in the report.  ASSESSMENT & PLAN:  Leukocytosis 1.  Leukocytosis: -CBC from 01/21/2020 shows white count 13.7 with increased absolute neutrophil count.  Differential showed 74% neutrophils, 19% lymphocytes, 4% monocytes, 2% eosinophils and 1% basophils. -Hemoglobin and platelets are normal. -Patient reports that he is aware of elevated white count for the last 4 to 5 years. -Denies any corticosteroid use.  Denies any history of splenectomy.  No recurrent infections.  No B symptoms. -Current active smoker, smokes 1 pack/day  for 34 years.  Works as a Airline pilot. -CT of the abdomen from 03/15/2019 reviewed by me shows normal spleen.  There is a right kidney lower pole cyst. -We will repeat his CBC with differential today.  We will check LDH and review his smear.  We will also check ESR/CRP. -We will check for myeloproliferative disorders including BCR/ABL by FISH and JAK2 V617F testing.  We will also check carboxyhemoglobin level. -If the above tests are negative, the likely explanation could be smoking related leukocytosis.  2.  Crohn's disease: -He reportedly has diarrhea as main manifestation. -He was prescribed balsalazide 3 capsules 3 times a day.  However he is not taking them on a regular basis.  Last time he took was a month ago.  He reports that the pills are too big to swallow. -He has a follow-up with Dr. Laural Golden.  3.  Family history: -Paternal grandmother had cancer.  Patient does not know the type.  Maternal grandfather died of lung cancer. -No family history of  leukemias.     All questions were answered. The patient knows to call the clinic with any problems, questions or concerns.      Derek Jack, MD 02/22/20 1:48 PM

## 2020-02-22 NOTE — Assessment & Plan Note (Addendum)
1.  Leukocytosis: -CBC from 01/21/2020 shows white count 13.7 with increased absolute neutrophil count.  Differential showed 74% neutrophils, 19% lymphocytes, 4% monocytes, 2% eosinophils and 1% basophils. -Hemoglobin and platelets are normal. -Patient reports that he is aware of elevated white count for the last 4 to 5 years. -Denies any corticosteroid use.  Denies any history of splenectomy.  No recurrent infections.  No B symptoms. -Current active smoker, smokes 1 pack/day for 34 years.  Works as a Airline pilot. -CT of the abdomen from 03/15/2019 reviewed by me shows normal spleen.  There is a right kidney lower pole cyst. -We will repeat his CBC with differential today.  We will check LDH and review his smear.  We will also check ESR/CRP. -We will check for myeloproliferative disorders including BCR/ABL by FISH and JAK2 V617F testing.  We will also check carboxyhemoglobin level. -If the above tests are negative, the likely explanation could be smoking related leukocytosis.  2.  Crohn's disease: -He reportedly has diarrhea as main manifestation. -He was prescribed balsalazide 3 capsules 3 times a day.  However he is not taking them on a regular basis.  Last time he took was a month ago.  He reports that the pills are too big to swallow. -He has a follow-up with Dr. Laural Golden.  3.  Family history: -Paternal grandmother had cancer.  Patient does not know the type.  Maternal grandfather died of lung cancer. -No family history of leukemias.

## 2020-02-23 LAB — ANTINUCLEAR ANTIBODIES, IFA: ANA Ab, IFA: NEGATIVE

## 2020-02-23 LAB — RHEUMATOID FACTOR: Rheumatoid fact SerPl-aCnc: <10 IU/mL (ref 0.0–13.9)

## 2020-02-25 ENCOUNTER — Other Ambulatory Visit: Payer: Self-pay | Admitting: Surgery

## 2020-02-25 DIAGNOSIS — K4090 Unilateral inguinal hernia, without obstruction or gangrene, not specified as recurrent: Secondary | ICD-10-CM

## 2020-03-01 LAB — JAK2 V617F, W REFLEX TO CALR/E12/MPL

## 2020-03-01 LAB — CALR + JAK2 E12-15 + MPL (REFLEXED)

## 2020-03-01 LAB — BCR-ABL1 FISH
Cells Analyzed: 200
Cells Counted: 200

## 2020-03-08 ENCOUNTER — Encounter (INDEPENDENT_AMBULATORY_CARE_PROVIDER_SITE_OTHER): Payer: Self-pay | Admitting: Internal Medicine

## 2020-03-08 ENCOUNTER — Ambulatory Visit (INDEPENDENT_AMBULATORY_CARE_PROVIDER_SITE_OTHER): Payer: 59 | Admitting: Internal Medicine

## 2020-03-08 ENCOUNTER — Other Ambulatory Visit: Payer: Self-pay

## 2020-03-08 VITALS — BP 122/83 | HR 78 | Temp 97.5°F | Ht 74.0 in | Wt 235.2 lb

## 2020-03-08 DIAGNOSIS — K50819 Crohn's disease of both small and large intestine with unspecified complications: Secondary | ICD-10-CM

## 2020-03-08 DIAGNOSIS — K219 Gastro-esophageal reflux disease without esophagitis: Secondary | ICD-10-CM

## 2020-03-08 DIAGNOSIS — K5080 Crohn's disease of both small and large intestine without complications: Secondary | ICD-10-CM | POA: Insufficient documentation

## 2020-03-08 DIAGNOSIS — Z8719 Personal history of other diseases of the digestive system: Secondary | ICD-10-CM

## 2020-03-08 MED ORDER — BUDESONIDE 3 MG PO CPEP: 3.0000 mg | ORAL_CAPSULE | Freq: Every day | ORAL | 1 refills | Status: DC

## 2020-03-08 MED ORDER — MESALAMINE ER 500 MG PO CPCR: 500.0000 mg | ORAL_CAPSULE | Freq: Four times a day (QID) | ORAL | 5 refills | Status: DC

## 2020-03-08 NOTE — Patient Instructions (Signed)
Entocort/budesonide schedule as follows. Take 9 mg by mouth every morning for 2 weeks. Take 6 mg by mouth every morning for 2 weeks. Take 3 mg by mouth every morning for 2 weeks. Please call office with progress report in 6 weeks. Will consider blood work either at 6 or 12 weeks depending on symptomatic response.  Can take Imodium/loperamide OTC 2 mg daily on as-needed basis.

## 2020-03-08 NOTE — Progress Notes (Signed)
Presenting complaint;  Follow-up for Crohn's disease.  Database and subjective:  Patient is 49 year old Caucasian male who has a history of Crohn's disease dating back to age 46.  His last colonoscopy was in December 2016 revealing mild disease to terminal ileum and colon.  He has been on balsalazide.  His last visit was virtual visit with Ms. Setzer NP on 03/20/2019.  Patient returns for scheduled visit.  He reports difficulty swallowing balsalazide pills because of their size.  He also states that he does not take 9 pills a day as he forgets.  He remains with diarrhea.  He has anywhere from 2-7 bowel movements per day.  He rarely has nocturnal bowel movement.  Most of his stools are soft and mushy with some loose stools and he may have a normal stool occasionally.  He has mild abdominal cramping urgency but he has not had any accidents.  He denies melena or rectal bleeding.  His appetite is normal.  He has lost 8 pounds since his last visit.  He feels weight loss is voluntary.  He complains of fatigue but denies skin rash or arthralgias.  He continues to smoke cigarettes.  He is smoking about a pack per day.  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  He remains with mildly elevated WBCs.  He is worried about non-Hodgkin's lymphoma which reportedly is more frequent in firefighters.  He has seen Dr. Delton Coombes and recently had blood work by him. He does not have difficulty swallowing food.  He denies fever chills or night sweats.  Heartburn is well controlled with PPI.  He states he rarely takes Valtrex for fever blisters.  Current Medications: Outpatient Encounter Medications as of 03/08/2020  Medication Sig  . pantoprazole (PROTONIX) 40 MG tablet TAKE 1 TABLET(40 MG) BY MOUTH DAILY  . PARoxetine (PAXIL) 20 MG tablet Take 20 mg by mouth daily.  . valACYclovir (VALTREX) 500 MG tablet Take 500 mg by mouth as needed.   . balsalazide (COLAZAL) 750 MG capsule Take 3 capsules  (2,250 mg total) by mouth 3 (three) times daily. (Patient not taking: Reported on 02/22/2020)   No facility-administered encounter medications on file as of 03/08/2020.    Objective: Blood pressure 122/83, pulse 78, temperature (!) 97.5 F (36.4 C), temperature source Temporal, height 6' 2"  (1.88 m), weight 235 lb 3.2 oz (106.7 kg). Patient is alert and in no acute distress. He is wearing a facial mask. Conjunctiva is pink. Sclera is nonicteric Oropharyngeal mucosa is normal. No neck masses or thyromegaly noted. Cardiac exam with regular rhythm normal S1 and S2. No murmur or gallop noted. Lungs are clear to auscultation. Abdomen is symmetrical and soft.  No organomegaly or masses. No LE edema or clubbing noted.  Labs/studies Results:  CBC Latest Ref Rng & Units 02/22/2020 02/25/2019 08/19/2018  WBC 4.0 - 10.5 K/uL 12.7(H) 13.6(H) 20.2(H)  Hemoglobin 13.0 - 17.0 g/dL 15.3 15.2 16.2  Hematocrit 39.0 - 52.0 % 45.5 43.4 46.8  Platelets 150 - 400 K/uL 329 346 311    CMP Latest Ref Rng & Units 04/21/2016 08/31/2015  Glucose 65 - 99 mg/dL 107(H) 94  BUN 6 - 20 mg/dL 7 19  Creatinine 0.61 - 1.24 mg/dL 0.90 1.00  Sodium 135 - 145 mmol/L 133(L) 137  Potassium 3.5 - 5.1 mmol/L 4.0 4.4  Chloride 101 - 111 mmol/L 100(L) 100(L)  CO2 22 - 32 mmol/L 23 -  Calcium 8.9 - 10.3 mg/dL 9.2 -  No flowsheet data found.  Lab Results  Component Value Date   CRP 1.5 (H) 02/22/2020    Sed rate on 02/22/2020 was 18.  Lab data from 01/21/2020 WBC 13.7 H&H 16.9 and 47.8 and platelet count 369K.  Assessment:  #1.  Ileocolonic Crohn's disease of 28 years duration.  His symptoms suggest mild to moderate disease activity.  He is not taking full dose oral mesalamine which would not help with ileal disease anyway.  It is interesting to note that his CRP remains mildly elevated.  It was 1-1/2 x 1 year ago and it is about the same now.  Mild leukocytosis and mildly elevated sed rate can be explained the basis of  inflammatory bowel disease. He is interested in biologic therapy which would carry much higher risk than oral mesalamine resuming it works. We will treat him with 6-week course of the desonide and change oral mesalamine to Pentasa which is bioavailability small bowel.  Would consider colonoscopy within the next few months depending on clinical response.   Plan:  Once again patient reminded that he needs to make every effort to quit cigarette smoking as it may also help in management of his IBD along with other benefits. But desonide 9 mg daily for 2 weeks followed by 6 mg daily for 2 weeks followed by 3 mg daily for 2 weeks. Discontinue balsalazide. Begin Pentasa 1 g by mouth 4 times a day. Loperamide OTC 2 mg once or twice daily as needed Patient will call with progress report in 6 weeks and if he is feeling better would do a CBC sed rate and CRP and delay colonoscopy for 3 to 4 months. However if he does not report symptomatic improvement with this combination will proceed with colonoscopy earlier. Office visit in 6 months.

## 2020-03-14 ENCOUNTER — Ambulatory Visit
Admission: RE | Admit: 2020-03-14 | Discharge: 2020-03-14 | Disposition: A | Discharge status: Home / Self Care | Payer: 59 | Source: Ambulatory Visit | Attending: Surgery | Admitting: Surgery

## 2020-03-14 DIAGNOSIS — K4090 Unilateral inguinal hernia, without obstruction or gangrene, not specified as recurrent: Secondary | ICD-10-CM

## 2020-03-14 IMAGING — CT CT ABD-PELV W/ CM
2 of 5 series · 14 of 46 positions shown, 16 images · IV contrast (iopamidol)
Comparison: 03/14/2009

CLINICAL DATA: History of Crohn disease, status post right inguinal
hernia repair. Pain.

EXAM:
CT ABDOMEN AND PELVIS WITH CONTRAST
TECHNIQUE: Multidetector CT imaging of the abdomen and pelvis was performed
using the standard protocol following bolus administration of
intravenous contrast.
CONTRAST:  125mL V8X7MP-2LL IOPAMIDOL (V8X7MP-2LL) INJECTION 61%

[Series 2: abd pelvis 5.00 br40 s3 axial · axial · 0.72mm/px · z∈[+1198,+1688]mm · 11 of 110 slices shown, 13 images]
[im 6/110  soft-tissue]
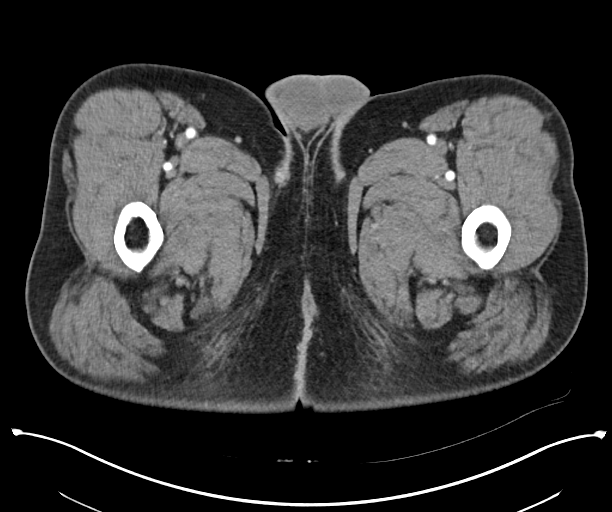
[im 6/110  bone]
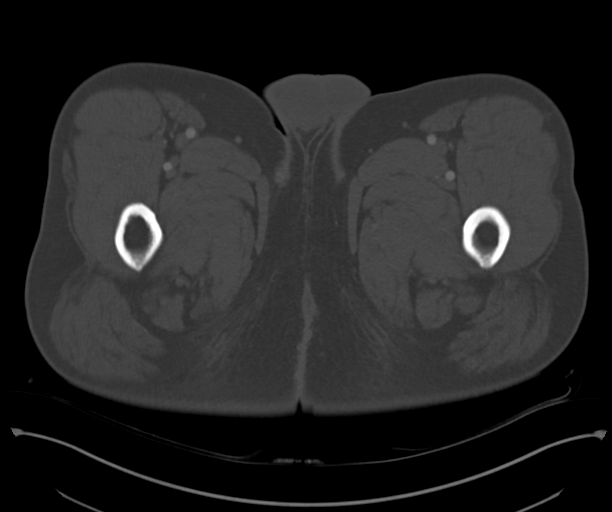
[im 18/110  soft-tissue]
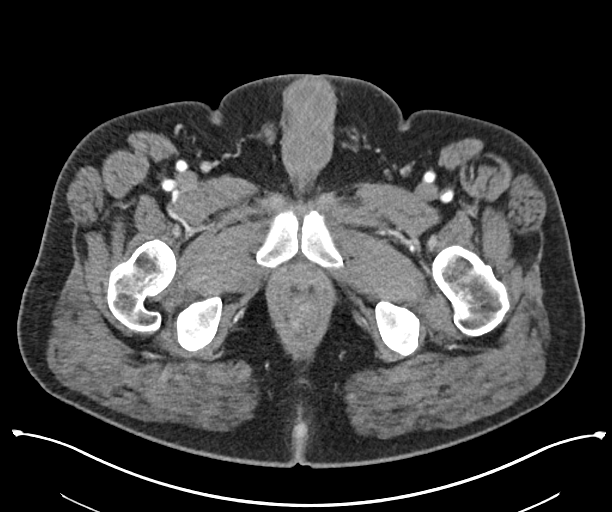
[im 29/110  soft-tissue]
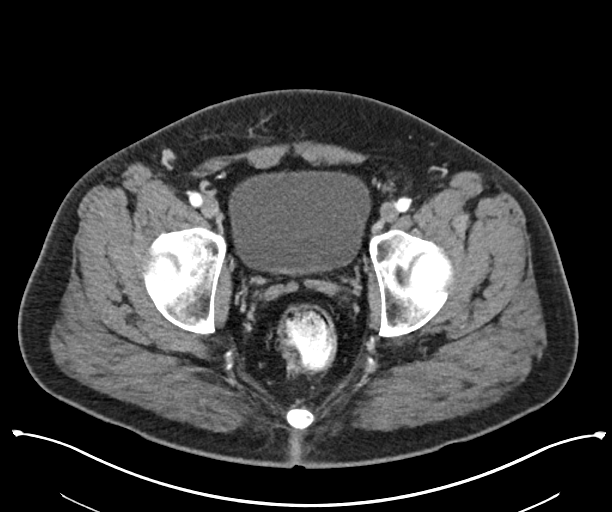
[im 35/110  soft-tissue]
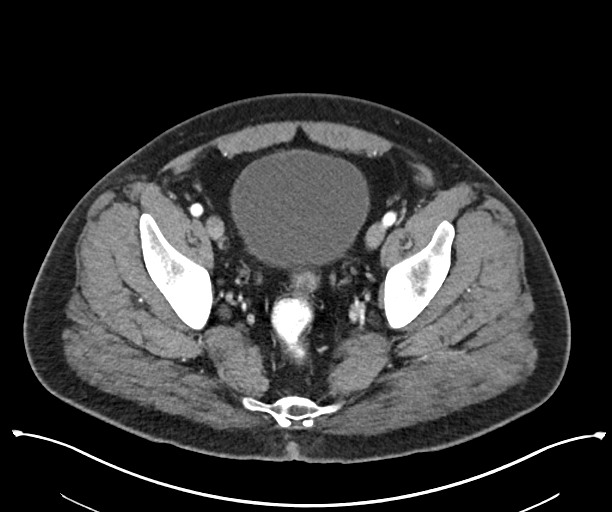
[im 46/110  soft-tissue]
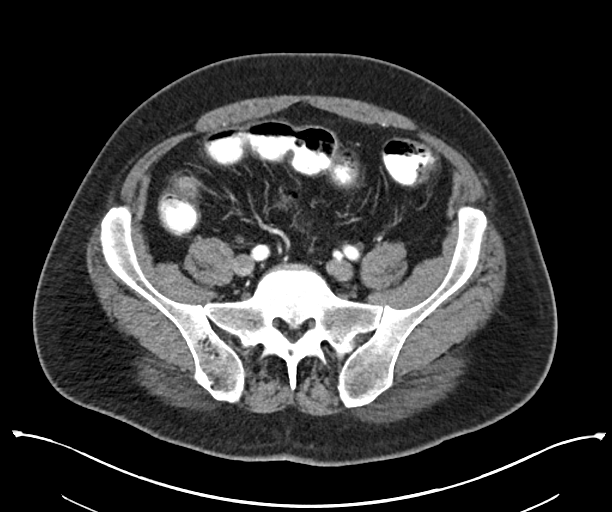
[im 58/110  soft-tissue]
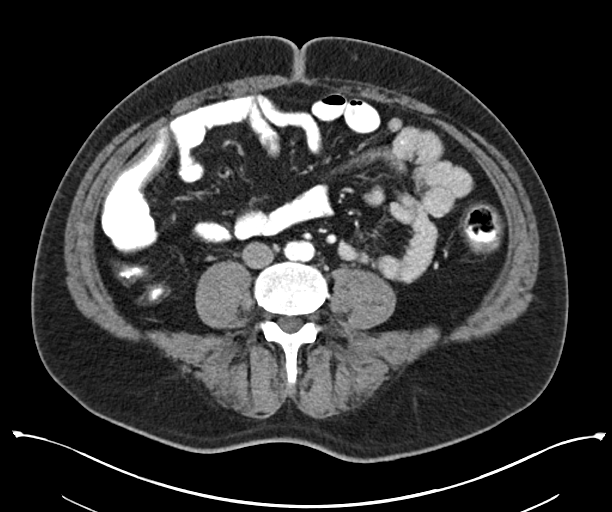
[im 64/110  soft-tissue]
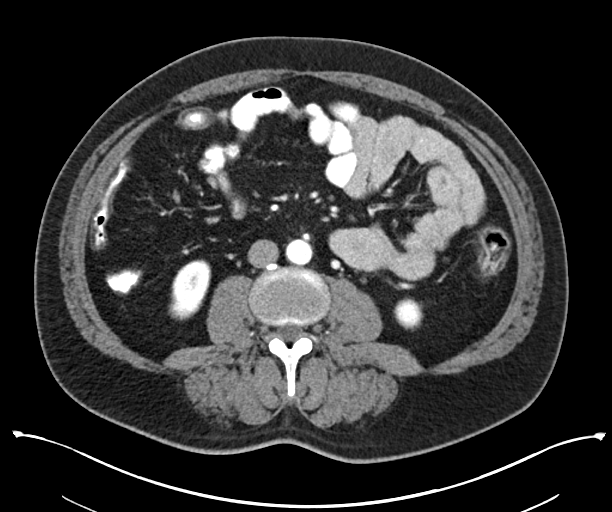
[im 75/110  soft-tissue]
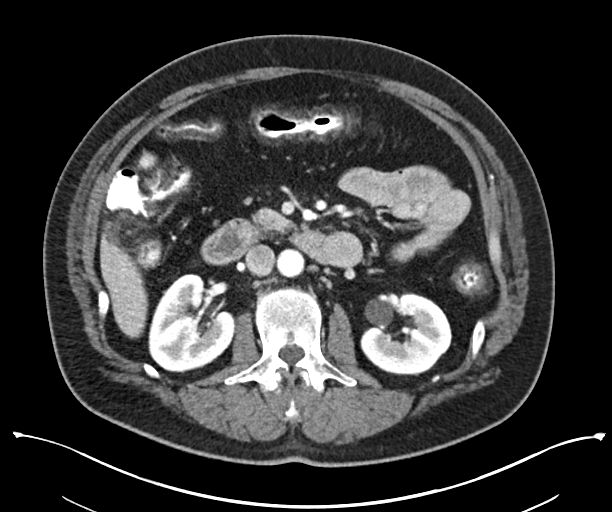
[im 81/110  soft-tissue]
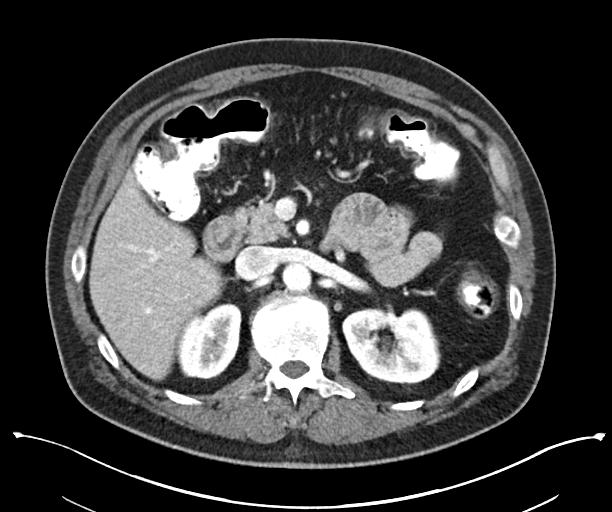
[im 81/110  bone]
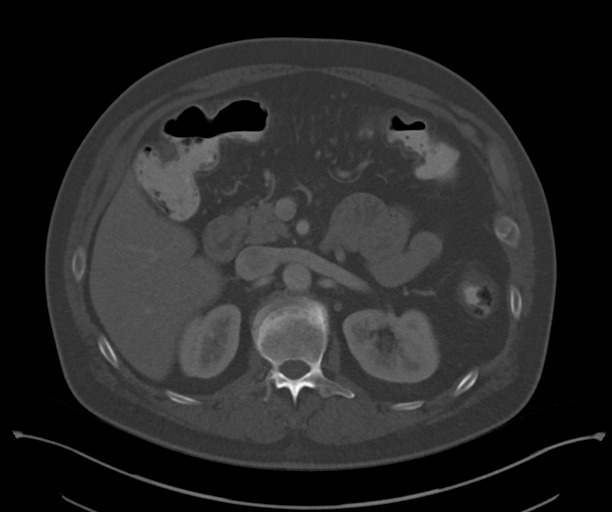
[im 92/110  soft-tissue]
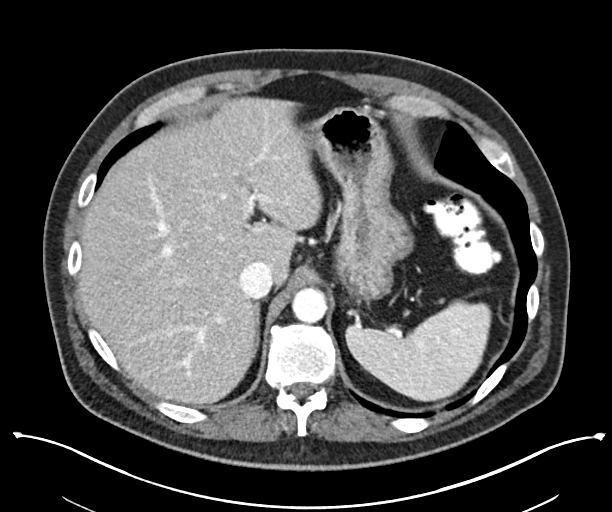
[im 104/110  soft-tissue]
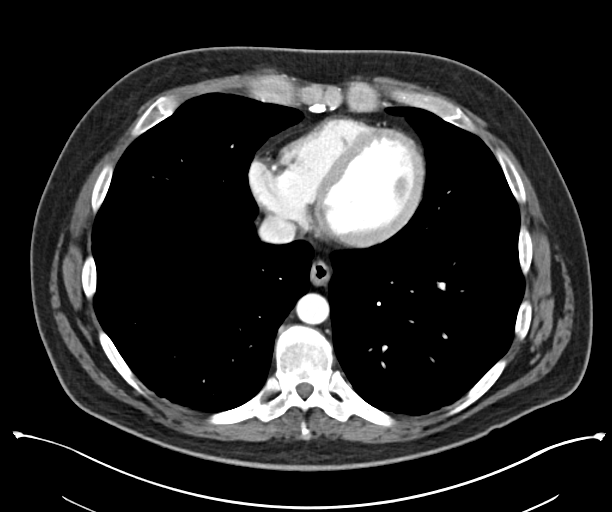

[Series 6: abd pelvis 2.00 br40 s3 cor · coronal · 0.86mm/px · 3 of 183 slices shown]
[im 61/183  soft-tissue]
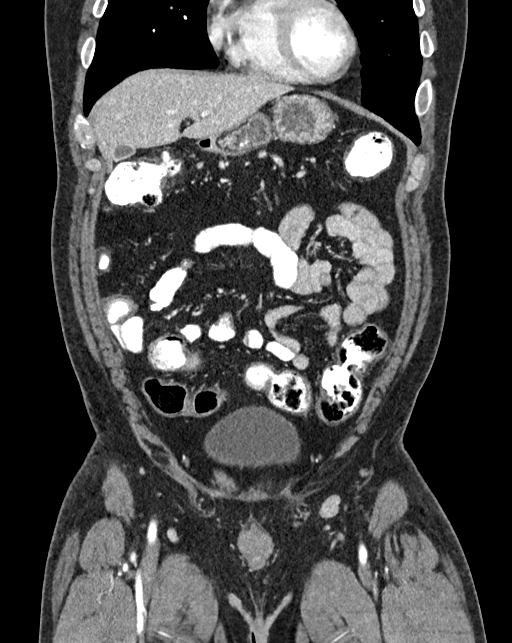
[im 81/183  soft-tissue]
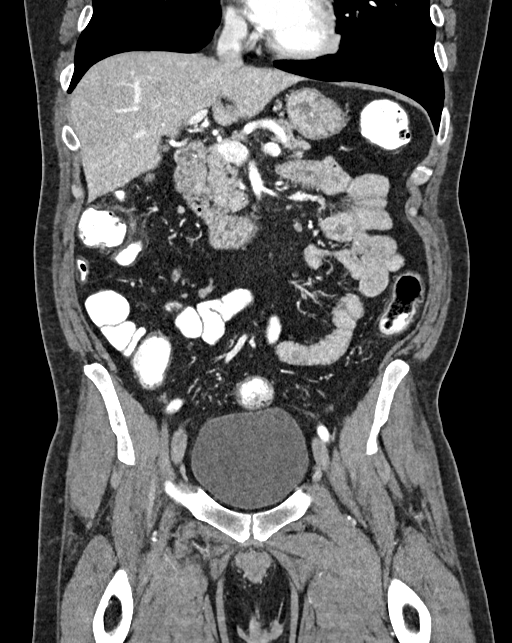
[im 102/183  soft-tissue]
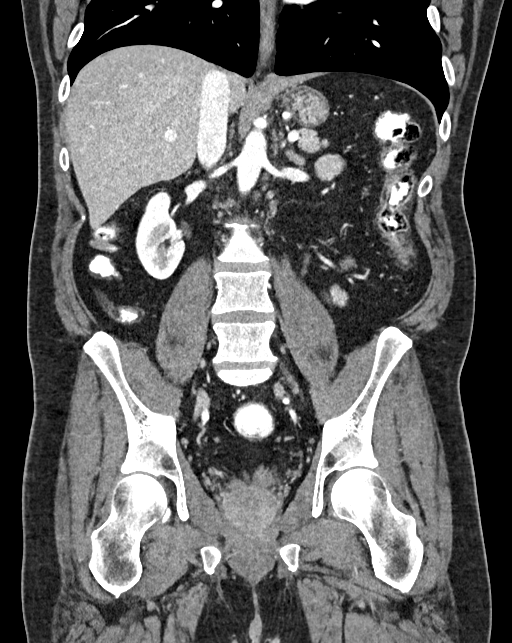

[14 of 46 positions shown; findings below may reference images not displayed]

FINDINGS: Lower chest: Unremarkable.

Hepatobiliary: No suspicious focal abnormality within the liver
parenchyma. There is no evidence for gallstones, gallbladder wall
thickening, or pericholecystic fluid. No intrahepatic or
extrahepatic biliary dilation.

Pancreas: No focal mass lesion. No dilatation of the main duct. No
intraparenchymal cyst. No peripancreatic edema.

Spleen: No splenomegaly. No focal mass lesion.

Adrenals/Urinary Tract: No adrenal nodule or mass. 2.5 cm
well-defined homogeneous low-density lesion lower pole right kidney
is compatible with a cyst. Tiny hypodensities scattered elsewhere in
the right kidney are too small to characterize but likely benign.
Left kidney unremarkable. No evidence for hydroureter. The urinary
bladder appears normal for the degree of distention.

Stomach/Bowel: Stomach is unremarkable. No gastric wall thickening.
No evidence of outlet obstruction. Duodenum is normally positioned
as is the ligament of Treitz. No small bowel wall thickening. No
small bowel dilatation. The terminal ileum is normal. The appendix
is normal. Fat density in the wall of the colon may be related to
sequelae of previous inflammation, but no pericolonic
edema/inflammation on the current study.

Vascular/Lymphatic: No abdominal aortic aneurysm. No abdominal
aortic atherosclerotic calcification. There is no gastrohepatic or
hepatoduodenal ligament lymphadenopathy. No retroperitoneal or
mesenteric lymphadenopathy. No pelvic sidewall lymphadenopathy.

Reproductive: The prostate gland and seminal vesicles are
unremarkable.

Other: No intraperitoneal free fluid.

Musculoskeletal: No evidence for recurrent groin hernia. No
worrisome lytic or sclerotic osseous abnormality.
IMPRESSION: No acute findings in the abdomen or pelvis. Specifically, no
findings to explain the patient's history of pain.

## 2020-03-14 MED ORDER — IOPAMIDOL (ISOVUE-300) INJECTION 61%
125.0000 mL | Freq: Once | INTRAVENOUS | Status: AC | PRN
  Administered 2020-03-14: 125 mL via INTRAVENOUS

## 2020-03-15 ENCOUNTER — Ambulatory Visit (HOSPITAL_COMMUNITY): Payer: 59 | Admitting: Hematology

## 2020-03-15 ENCOUNTER — Telehealth (INDEPENDENT_AMBULATORY_CARE_PROVIDER_SITE_OTHER): Payer: Self-pay | Admitting: Internal Medicine

## 2020-03-15 NOTE — Telephone Encounter (Signed)
Patient left message stating he would like a call back regarding medication that Dr Laural Golden has put him on - ph# [phone]

## 2020-03-16 NOTE — Telephone Encounter (Signed)
Patient wanted to know how to take the Endocort, as on the medication bottle it had 24m daily. We reviewed the correct dosing. Patient also ask about the Pentasa not being covered. Insurance is asking that the patient try Apriso , Lialda.  Patient will be made aware once Dr.Rehman makes his decision.

## 2020-03-28 ENCOUNTER — Telehealth (INDEPENDENT_AMBULATORY_CARE_PROVIDER_SITE_OTHER): Payer: Self-pay | Admitting: Internal Medicine

## 2020-03-28 ENCOUNTER — Encounter (INDEPENDENT_AMBULATORY_CARE_PROVIDER_SITE_OTHER): Payer: Self-pay

## 2020-03-28 NOTE — Telephone Encounter (Signed)
Patient left voice mail message stating he is having problems with his medication - please advise - [phone]

## 2020-03-28 NOTE — Telephone Encounter (Signed)
Please let the patient know that Dr.Rehman is reviewing the medications and we will let him know as soon as possible.

## 2020-03-29 ENCOUNTER — Other Ambulatory Visit (INDEPENDENT_AMBULATORY_CARE_PROVIDER_SITE_OTHER): Payer: Self-pay | Admitting: *Deleted

## 2020-03-29 ENCOUNTER — Encounter (INDEPENDENT_AMBULATORY_CARE_PROVIDER_SITE_OTHER): Payer: Self-pay | Admitting: *Deleted

## 2020-03-29 DIAGNOSIS — K50819 Crohn's disease of both small and large intestine with unspecified complications: Secondary | ICD-10-CM

## 2020-04-06 ENCOUNTER — Encounter (INDEPENDENT_AMBULATORY_CARE_PROVIDER_SITE_OTHER): Payer: Self-pay | Admitting: *Deleted

## 2020-04-08 ENCOUNTER — Telehealth (INDEPENDENT_AMBULATORY_CARE_PROVIDER_SITE_OTHER): Payer: Self-pay | Admitting: *Deleted

## 2020-04-08 MED ORDER — SUTAB 1479-225-188 MG PO TABS: 1.0000 | ORAL_TABLET | Freq: Once | ORAL | 0 refills | Status: AC

## 2020-04-08 NOTE — Telephone Encounter (Signed)
Patient needs Sutab (copay card) ° °

## 2020-04-14 ENCOUNTER — Encounter (INDEPENDENT_AMBULATORY_CARE_PROVIDER_SITE_OTHER): Payer: Self-pay

## 2020-04-18 ENCOUNTER — Encounter (INDEPENDENT_AMBULATORY_CARE_PROVIDER_SITE_OTHER): Payer: Self-pay

## 2020-04-19 ENCOUNTER — Encounter (INDEPENDENT_AMBULATORY_CARE_PROVIDER_SITE_OTHER): Payer: Self-pay

## 2020-04-19 ENCOUNTER — Other Ambulatory Visit (INDEPENDENT_AMBULATORY_CARE_PROVIDER_SITE_OTHER): Payer: Self-pay | Admitting: *Deleted

## 2020-04-20 ENCOUNTER — Other Ambulatory Visit (INDEPENDENT_AMBULATORY_CARE_PROVIDER_SITE_OTHER): Payer: Self-pay | Admitting: *Deleted

## 2020-04-26 ENCOUNTER — Other Ambulatory Visit (HOSPITAL_COMMUNITY)
Admission: RE | Admit: 2020-04-26 | Discharge: 2020-04-26 | Disposition: A | Discharge status: Home / Self Care | Payer: 59 | Source: Ambulatory Visit | Attending: Internal Medicine | Admitting: Internal Medicine

## 2020-04-26 ENCOUNTER — Other Ambulatory Visit: Payer: Self-pay

## 2020-04-26 DIAGNOSIS — Z01812 Encounter for preprocedural laboratory examination: Secondary | ICD-10-CM | POA: Insufficient documentation

## 2020-04-26 DIAGNOSIS — Z20822 Contact with and (suspected) exposure to covid-19: Secondary | ICD-10-CM | POA: Diagnosis not present

## 2020-04-27 ENCOUNTER — Encounter (INDEPENDENT_AMBULATORY_CARE_PROVIDER_SITE_OTHER): Payer: Self-pay

## 2020-04-27 LAB — SARS CORONAVIRUS 2 (TAT 6-24 HRS): SARS Coronavirus 2: NEGATIVE

## 2020-04-28 ENCOUNTER — Ambulatory Visit (HOSPITAL_COMMUNITY)
Admission: RE | Admit: 2020-04-28 | Discharge: 2020-04-28 | Disposition: A | Discharge status: Home / Self Care | Payer: 59 | Attending: Internal Medicine | Admitting: Internal Medicine

## 2020-04-28 ENCOUNTER — Encounter (HOSPITAL_COMMUNITY)
Admission: RE | Disposition: A | Discharge status: Home / Self Care | Payer: Self-pay | Source: Home / Self Care | Attending: Internal Medicine

## 2020-04-28 ENCOUNTER — Ambulatory Visit (HOSPITAL_COMMUNITY): Admission: RE | Admit: 2020-04-28 | Payer: 59 | Source: Home / Self Care | Admitting: Internal Medicine

## 2020-04-28 ENCOUNTER — Encounter (HOSPITAL_COMMUNITY): Admission: RE | Payer: Self-pay | Source: Home / Self Care

## 2020-04-28 ENCOUNTER — Other Ambulatory Visit: Payer: Self-pay

## 2020-04-28 DIAGNOSIS — K633 Ulcer of intestine: Secondary | ICD-10-CM | POA: Insufficient documentation

## 2020-04-28 DIAGNOSIS — Z7952 Long term (current) use of systemic steroids: Secondary | ICD-10-CM | POA: Insufficient documentation

## 2020-04-28 DIAGNOSIS — K50819 Crohn's disease of both small and large intestine with unspecified complications: Secondary | ICD-10-CM

## 2020-04-28 DIAGNOSIS — Z1211 Encounter for screening for malignant neoplasm of colon: Secondary | ICD-10-CM | POA: Diagnosis not present

## 2020-04-28 DIAGNOSIS — K644 Residual hemorrhoidal skin tags: Secondary | ICD-10-CM | POA: Insufficient documentation

## 2020-04-28 DIAGNOSIS — K5090 Crohn's disease, unspecified, without complications: Secondary | ICD-10-CM | POA: Diagnosis not present

## 2020-04-28 DIAGNOSIS — K5730 Diverticulosis of large intestine without perforation or abscess without bleeding: Secondary | ICD-10-CM | POA: Insufficient documentation

## 2020-04-28 DIAGNOSIS — K648 Other hemorrhoids: Secondary | ICD-10-CM | POA: Insufficient documentation

## 2020-04-28 DIAGNOSIS — K523 Indeterminate colitis: Secondary | ICD-10-CM | POA: Diagnosis not present

## 2020-04-28 DIAGNOSIS — K6289 Other specified diseases of anus and rectum: Secondary | ICD-10-CM | POA: Diagnosis not present

## 2020-04-28 HISTORY — PX: BIOPSY: SHX5522

## 2020-04-28 HISTORY — PX: COLONOSCOPY: SHX5424

## 2020-04-28 SURGERY — COLONOSCOPY
Anesthesia: Moderate Sedation

## 2020-04-28 MED ORDER — MEPERIDINE HCL 50 MG/ML IJ SOLN
INTRAMUSCULAR | Status: AC
  Filled 2020-04-28: qty 1

## 2020-04-28 MED ORDER — MIDAZOLAM HCL 5 MG/5ML IJ SOLN
INTRAMUSCULAR | Status: AC
  Filled 2020-04-28: qty 10

## 2020-04-28 MED ORDER — MEPERIDINE HCL 50 MG/ML IJ SOLN
INTRAMUSCULAR | Status: DC | PRN
  Administered 2020-04-28 (×4): 25 mg

## 2020-04-28 MED ORDER — SODIUM CHLORIDE 0.9 % IV SOLN: INTRAVENOUS | Status: DC

## 2020-04-28 MED ORDER — MIDAZOLAM HCL 5 MG/5ML IJ SOLN
INTRAMUSCULAR | Status: DC | PRN
  Administered 2020-04-28: 3 mg via INTRAVENOUS
  Administered 2020-04-28: 2 mg via INTRAVENOUS
  Administered 2020-04-28: 3 mg via INTRAVENOUS
  Administered 2020-04-28: 2 mg via INTRAVENOUS

## 2020-04-28 MED ORDER — HYDROCORTISONE ACETATE 25 MG RE SUPP: 25.0000 mg | Freq: Every day | RECTAL | 1 refills | Status: DC

## 2020-04-28 NOTE — H&P (Signed)
Aaron Phillips is an 49 y.o. male.   Chief Complaint: Patient is here for colonoscopy. HPI: Patient is 49 year old Caucasian male with 28-year history of ileocolonic Crohn's disease who remains with chronic diarrhea.  Usually has 2-3 bowel movements per day.  He has been on Pentasa which has not helped much.  He was also treated with but desonide and it did not make any difference either.  He has some cramping but no rectal bleeding or melena.  He has good appetite.  He is undergoing colonoscopy both for surveillance to determine if his disease is active.   Past Medical History:  Diagnosis Date  . Anxiety   . Crohn's disease ([place])    As a child he was dx.  . Crohn's disease [facility])     Past Surgical History:  Procedure Laterality Date  . COLONOSCOPY N/A 12/09/2015   Procedure: COLONOSCOPY;  Surgeon: Rogene Houston, MD;  Location: [facility];  Service: Endoscopy;  Laterality: N/A;  230  . INGUINAL HERNIA REPAIR  2017    Family History  Problem Relation Age of Onset  . Hypertension Mother   . Hypertension Father   . Yves Dill Parkinson White syndrome Sister    Social History:  reports that he has been smoking. He uses smokeless tobacco. He reports current alcohol use. He reports that he does not use drugs.  Allergies: No Known Allergies  Medications Prior to Admission  Medication Sig Dispense Refill  . budesonide (ENTOCORT EC) 3 MG 24 hr capsule Take 1 capsule (3 mg total) by mouth daily. (Patient taking differently: Take 6 mg by mouth daily. ) 90 capsule 1  . Melatonin 3 MG CAPS Take 3 mg by mouth at bedtime as needed (sleep).    . pantoprazole (PROTONIX) 40 MG tablet TAKE 1 TABLET(40 MG) BY MOUTH DAILY (Patient taking differently: Take 40 mg by mouth daily as needed (acid reflux). ) 90 tablet 3  . Pseudoephedrine HCl (SUDAFED SINUS CONGESTION 24HR) 240 MG TB24 Take 240 mg by mouth daily as needed (allergies).    . valACYclovir (VALTREX) 500 MG tablet Take 500 mg by mouth daily as  needed (fever blisters).     Marland Kitchen ibuprofen (ADVIL) 200 MG tablet Take 800 mg by mouth daily as needed for headache or moderate pain.    . mesalamine (PENTASA) 500 MG CR capsule Take 1 capsule (500 mg total) by mouth 4 (four) times daily. 320 capsule 5    No results found for this or any previous visit (from the past 48 hour(s)). No results found.  Review of Systems  Blood pressure (!) 130/97, pulse 65, temperature 98.8 F (37.1 C), temperature source Oral, resp. rate 18, height 6' 2"  (1.88 m), SpO2 100 %. Physical Exam  Constitutional: He appears well-developed and well-nourished.  HENT:  Mouth/Throat: Oropharynx is clear and moist.  Eyes: Conjunctivae are normal. No scleral icterus.  Neck: No thyromegaly present.  Cardiovascular: Normal rate, regular rhythm and normal heart sounds.  No murmur heard. Respiratory: Effort normal and breath sounds normal.  GI: Soft. He exhibits no distension and no mass. There is no abdominal tenderness.  Musculoskeletal:        General: No edema.  Neurological: He is alert.  Skin: Skin is warm and dry.  Tattoo over left forearm and arm.     Assessment/Plan Ileocolonic Crohn's disease. Surveillance and diagnostic colonoscopy.  Hildred Laser, MD 04/28/2020, 1:47 PM

## 2020-04-28 NOTE — Discharge Instructions (Signed)
No aspirin or NSAIDs for 24 hours. Resume usual medications as before Resume usual diet Anusol HC Suppository 1 per rectum daily at bedtime for 2 weeks No driving for 24 hours Physician will call with biopsy results and further recommendations.  PATIENT INSTRUCTIONS POST-ANESTHESIA  IMMEDIATELY FOLLOWING SURGERY:  Do not drive or operate machinery for the first twenty four hours after surgery.  Do not make any important decisions for twenty four hours after surgery or while taking narcotic pain medications or sedatives.  If you develop intractable nausea and vomiting or a severe headache please notify your doctor immediately.  FOLLOW-UP:  Please make an appointment with your surgeon as instructed. You do not need to follow up with anesthesia unless specifically instructed to do so.  WOUND CARE INSTRUCTIONS (if applicable):  Keep a dry clean dressing on the anesthesia/puncture wound site if there is drainage.  Once the wound has quit draining you may leave it open to air.  Generally you should leave the bandage intact for twenty four hours unless there is drainage.  If the epidural site drains for more than 36-48 hours please call the anesthesia department.  QUESTIONS?:  Please feel free to call your physician or the hospital operator if you have any questions, and they will be happy to assist you.      Colonoscopy, Adult, Care After This sheet gives you information about how to care for yourself after your procedure. Your doctor may also give you more specific instructions. If you have problems or questions, call your doctor. What can I expect after the procedure? After the procedure, it is common to have:  A small amount of blood in your poop (stool) for 24 hours.  Some gas.  Mild cramping or bloating in your belly (abdomen). Follow these instructions at home: Eating and drinking   Drink enough fluid to keep your pee (urine) pale yellow.  Follow instructions from your doctor about  what you cannot eat or drink.  Return to your normal diet as told by your doctor. Avoid heavy or fried foods that are hard to digest. Activity  Rest as told by your doctor.  Do not sit for a long time without moving. Get up to take short walks every 1-2 hours. This is important. Ask for help if you feel weak or unsteady.  Return to your normal activities as told by your doctor. Ask your doctor what activities are safe for you. To help cramping and bloating:   Try walking around.  Put heat on your belly as told by your doctor. Use the heat source that your doctor recommends, such as a moist heat pack or a heating pad. ? Put a towel between your skin and the heat source. ? Leave the heat on for 20-30 minutes. ? Remove the heat if your skin turns bright red. This is very important if you are unable to feel pain, heat, or cold. You may have a greater risk of getting burned. General instructions  For the first 24 hours after the procedure: ? Do not drive or use machinery. ? Do not sign important documents. ? Do not drink alcohol. ? Do your daily activities more slowly than normal. ? Eat foods that are soft and easy to digest.  Take over-the-counter or prescription medicines only as told by your doctor.  Keep all follow-up visits as told by your doctor. This is important. Contact a doctor if:  You have blood in your poop 2-3 days after the procedure. Get help  right away if:  You have more than a small amount of blood in your poop.  You see large clumps of tissue (blood clots) in your poop.  Your belly is swollen.  You feel like you may vomit (nauseous).  You vomit.  You have a fever.  You have belly pain that gets worse, and medicine does not help your pain. Summary  After the procedure, it is common to have a small amount of blood in your poop. You may also have mild cramping and bloating in your belly.  For the first 24 hours after the procedure, do not drive or use  machinery, do not sign important documents, and do not drink alcohol.  Get help right away if you have a lot of blood in your poop, feel like you may vomit, have a fever, or have more belly pain. This information is not intended to replace advice given to you by your health care provider. Make sure you discuss any questions you have with your health care provider. Document Revised: 07/06/2019 Document Reviewed: 07/06/2019 Elsevier Patient Education  [place].    Diverticulosis  Diverticulosis is a condition that develops when small pouches (diverticula) form in the wall of the large intestine (colon). The colon is where water is absorbed and stool (feces) is formed. The pouches form when the inside layer of the colon pushes through weak spots in the outer layers of the colon. You may have a few pouches or many of them. The pouches usually do not cause problems unless they become inflamed or infected. When this happens, the condition is called diverticulitis. What are the causes? The cause of this condition is not known. What increases the risk? The following factors may make you more likely to develop this condition:  Being older than age 27. Your risk for this condition increases with age. Diverticulosis is rare among people younger than age 58. By age 67, many people have it.  Eating a low-fiber diet.  Having frequent constipation.  Being overweight.  Not getting enough exercise.  Smoking.  Taking over-the-counter pain medicines, like aspirin and ibuprofen.  Having a family history of diverticulosis. What are the signs or symptoms? In most people, there are no symptoms of this condition. If you do have symptoms, they may include:  Bloating.  Cramps in the abdomen.  Constipation or diarrhea.  Pain in the lower left side of the abdomen. How is this diagnosed? Because diverticulosis usually has no symptoms, it is most often diagnosed during an exam for other colon  problems. The condition may be diagnosed by:  Using a flexible scope to examine the colon (colonoscopy).  Taking an X-ray of the colon after dye has been put into the colon (barium enema).  Having a CT scan. How is this treated? You may not need treatment for this condition. Your health care provider may recommend treatment to prevent problems. You may need treatment if you have symptoms or if you previously had diverticulitis. Treatment may include:  Eating a high-fiber diet.  Taking a fiber supplement.  Taking a live bacteria supplement (probiotic).  Taking medicine to relax your colon. Follow these instructions at home: Medicines  Take over-the-counter and prescription medicines only as told by your health care provider.  If told by your health care provider, take a fiber supplement or probiotic. Constipation prevention Your condition may cause constipation. To prevent or treat constipation, you may need to:  Drink enough fluid to keep your urine pale yellow.  Take over-the-counter or prescription medicines.  Eat foods that are high in fiber, such as beans, whole grains, and fresh fruits and vegetables.  Limit foods that are high in fat and processed sugars, such as fried or sweet foods.  General instructions  Try not to strain when you have a bowel movement.  Keep all follow-up visits as told by your health care provider. This is important. Contact a health care provider if you:  Have pain in your abdomen.  Have bloating.  Have cramps.  Have not had a bowel movement in 3 days. Get help right away if:  Your pain gets worse.  Your bloating becomes very bad.  You have a fever or chills, and your symptoms suddenly get worse.  You vomit.  You have bowel movements that are bloody or black.  You have bleeding from your rectum. Summary  Diverticulosis is a condition that develops when small pouches (diverticula) form in the wall of the large intestine  (colon).  You may have a few pouches or many of them.  This condition is most often diagnosed during an exam for other colon problems.  Treatment may include increasing the fiber in your diet, taking supplements, or taking medicines. This information is not intended to replace advice given to you by your health care provider. Make sure you discuss any questions you have with your health care provider. Document Revised: 07/09/2019 Document Reviewed: 07/09/2019 Elsevier Patient Education  [place].   Hemorrhoids Hemorrhoids are swollen veins that may develop:  In the butt (rectum). These are called internal hemorrhoids.  Around the opening of the butt (anus). These are called external hemorrhoids. Hemorrhoids can cause pain, itching, or bleeding. Most of the time, they do not cause serious problems. They usually get better with diet changes, lifestyle changes, and other home treatments. What are the causes? This condition may be caused by:  Having trouble pooping (constipation).  Pushing hard (straining) to poop.  Watery poop (diarrhea).  Pregnancy.  Being very overweight (obese).  Sitting for long periods of time.  Heavy lifting or other activity that causes you to strain.  Anal sex.  Riding a bike for a long period of time. What are the signs or symptoms? Symptoms of this condition include:  Pain.  Itching or soreness in the butt.  Bleeding from the butt.  Leaking poop.  Swelling in the area.  One or more lumps around the opening of your butt. How is this diagnosed? A doctor can often diagnose this condition by looking at the affected area. The doctor may also:  Do an exam that involves feeling the area with a gloved hand (digital rectal exam).  Examine the area inside your butt using a small tube (anoscope).  Order blood tests. This may be done if you have lost a lot of blood.  Have you get a test that involves looking inside the colon using a  flexible tube with a camera on the end (sigmoidoscopy or colonoscopy). How is this treated? This condition can usually be treated at home. Your doctor may tell you to change what you eat, make lifestyle changes, or try home treatments. If these do not help, procedures can be done to remove the hemorrhoids or make them smaller. These may involve:  Placing rubber bands at the base of the hemorrhoids to cut off their blood supply.  Injecting medicine into the hemorrhoids to shrink them.  Shining a type of light energy onto the hemorrhoids to cause them to  fall off.  Doing surgery to remove the hemorrhoids or cut off their blood supply. Follow these instructions at home: Eating and drinking   Eat foods that have a lot of fiber in them. These include whole grains, beans, nuts, fruits, and vegetables.  Ask your doctor about taking products that have added fiber (fibersupplements).  Reduce the amount of fat in your diet. You can do this by: ? Eating low-fat dairy products. ? Eating less red meat. ? Avoiding processed foods.  Drink enough fluid to keep your pee (urine) pale yellow. Managing pain and swelling   Take a warm-water bath (sitz bath) for 20 minutes to ease pain. Do this 3-4 times a day. You may do this in a bathtub or using a portable sitz bath that fits over the toilet.  If told, put ice on the painful area. It may be helpful to use ice between your warm baths. ? Put ice in a plastic bag. ? Place a towel between your skin and the bag. ? Leave the ice on for 20 minutes, 2-3 times a day. General instructions  Take over-the-counter and prescription medicines only as told by your doctor. ? Medicated creams and medicines may be used as told.  Exercise often. Ask your doctor how much and what kind of exercise is best for you.  Go to the bathroom when you have the urge to poop. Do not wait.  Avoid pushing too hard when you poop.  Keep your butt dry and clean. Use wet toilet  paper or moist towelettes after pooping.  Do not sit on the toilet for a long time.  Keep all follow-up visits as told by your doctor. This is important. Contact a doctor if you:  Have pain and swelling that do not get better with treatment or medicine.  Have trouble pooping.  Cannot poop.  Have pain or swelling outside the area of the hemorrhoids. Get help right away if you have:  Bleeding that will not stop. Summary  Hemorrhoids are swollen veins in the butt or around the opening of the butt.  They can cause pain, itching, or bleeding.  Eat foods that have a lot of fiber in them. These include whole grains, beans, nuts, fruits, and vegetables.  Take a warm-water bath (sitz bath) for 20 minutes to ease pain. Do this 3-4 times a day. This information is not intended to replace advice given to you by your health care provider. Make sure you discuss any questions you have with your health care provider. Document Revised: 12/18/2018 Document Reviewed: 05/01/2018 Elsevier Patient Education  [place].

## 2020-04-28 NOTE — Op Note (Signed)
[facility] Patient Name: Aaron Phillips Procedure Date: 04/28/2020 1:26 PM MRN: [id number] Date of Birth: [birth year] Attending MD: Hildred Laser , MD CSN: [id number] Age: 49 Admit Type: Outpatient Procedure:                Colonoscopy Indications:              High risk colon cancer surveillance: Inflammatory                            bowel disease (unclassified) of 8 (or more) years                            duration with one-third (or more) of the colon                            involved Providers:                Hildred Laser, MD, Janeece Riggers, RN, Aram Candela Referring MD:             [employer]mond [employer], MD Medicines:                Meperidine 100 mg IV, Midazolam 10 mg IV Complications:            No immediate complications. Estimated Blood Loss:     Estimated blood loss was minimal. Procedure:                Pre-Anesthesia Assessment:                           - Prior to the procedure, a History and Physical                            was performed, and patient medications and                            allergies were reviewed. The patient's tolerance of                            previous anesthesia was also reviewed. The risks                            and benefits of the procedure and the sedation                            options and risks were discussed with the patient.                            All questions were answered, and informed consent                            was obtained. Prior Anticoagulants: The patient has                            taken no previous anticoagulant or antiplatelet  agents except for NSAID medication. ASA Grade                            Assessment: II - A patient with mild systemic                            disease. After reviewing the risks and benefits,                            the patient was deemed in satisfactory condition to                            undergo the procedure.   After obtaining informed consent, the colonoscope                            was passed under direct vision. Throughout the                            procedure, the patient's blood pressure, pulse, and                            oxygen saturations were monitored continuously. The                            [id number]-[id number] ([id number]) scope was introduced through                            the anus and advanced to the the terminal ileum,                            with identification of the appendiceal orifice and                            IC valve. The colonoscopy was performed without                            difficulty. The patient tolerated the procedure                            well. The quality of the bowel preparation was                            adequate. The terminal ileum, ileocecal valve,                            appendiceal orifice, and rectum were photographed. Scope In: 1:58:16 PM Scope Out: 2:32:27 PM Scope Withdrawal Time: 0 hours 24 minutes 49 seconds  Total Procedure Duration: 0 hours 34 minutes 11 seconds  Findings:      The perianal and digital rectal examinations were normal.      The terminal ileum contained a few scattered non-bleeding erosions.       Biopsies were taken with a cold forceps for histology. The pathology  specimen was placed into Bottle Number 1.      An area of mildly congested mucosa was found in the sigmoid colon, in       the descending colon, at the splenic flexure, in the transverse colon,       at the hepatic flexure and in the ascending colon. This was biopsied       with a cold forceps for histology. The pathology specimen was placed       into seperate bottles( B2-ascending colon; B3- hepatic flexure and       transverse colon; B4 descending colon; B5 sigmoid colon and B6 rectum)      A few diverticula were found in the splenic flexure.      External and internal hemorrhoids were found during retroflexion. The       hemorrhoids were  small. Impression:               - A few erosions in the terminal ileum. Biopsied.                           - Congested mucosa with few scattered ersions in                            the sigmoid colon, in the descending colon, at the                            splenic flexure, in the transverse colon, at the                            hepatic flexure and in the ascending colon.                            Biopsied.                           - Diverticulosis at the splenic flexure.                           - External and internal hemorrhoids. Moderate Sedation:      Moderate (conscious) sedation was administered by the endoscopy nurse       and supervised by the endoscopist. The following parameters were       monitored: oxygen saturation, heart rate, blood pressure, CO2       capnography and response to care. Total physician intraservice time was       41 minutes. Recommendation:           - Patient has a contact number available for                            emergencies. The signs and symptoms of potential                            delayed complications were discussed with the                            patient. Return to normal activities tomorrow.  Written discharge instructions were provided to the                            patient.                           - Resume previous diet today.                           - Continue present medications.                           - No aspirin, ibuprofen, naproxen, or other                            non-steroidal anti-inflammatory drugs for 1 day.                           - Await pathology results.                           - Use hydrocortisone suppository 25 mg 1 per rectum                            once a day for 2 weeks. Procedure Code(s):        --- Professional ---                           [phone], Colonoscopy, flexible; with biopsy, single                            or multiple                            99153, Moderate sedation; each additional 15                            minutes intraservice time                           99153, Moderate sedation; each additional 15                            minutes intraservice time                           G0500, Moderate sedation services provided by the                            same physician or other qualified health care                            professional performing a gastrointestinal                            endoscopic service that sedation supports,  requiring the presence of an independent trained                            observer to assist in the monitoring of the                            patient's level of consciousness and physiological                            status; initial 15 minutes of intra-service time;                            patient age 66 years or older (additional time may                            be reported with [phone], as appropriate) Diagnosis Code(s):        --- Professional ---                           K52.3, Indeterminate colitis                           K63.3, Ulcer of intestine                           K63.89, Other specified diseases of intestine                           K64.8, Other hemorrhoids                           K57.30, Diverticulosis of large intestine without                            perforation or abscess without bleeding CPT copyright 2019 [employer]. All rights reserved. The codes documented in this report are preliminary and upon coder review may  be revised to meet current compliance requirements. Hildred Laser, MD Hildred Laser, MD 04/28/2020 2:58:13 PM This report has been signed electronically. Number of Addenda: 0

## 2020-05-02 ENCOUNTER — Other Ambulatory Visit (INDEPENDENT_AMBULATORY_CARE_PROVIDER_SITE_OTHER): Payer: Self-pay | Admitting: Internal Medicine

## 2020-05-02 LAB — SURGICAL PATHOLOGY

## 2020-05-02 MED ORDER — DICYCLOMINE HCL 10 MG PO CAPS: 10.0000 mg | ORAL_CAPSULE | Freq: Three times a day (TID) | ORAL | 5 refills | Status: DC

## 2020-05-02 MED ORDER — MESALAMINE 800 MG PO TBEC: 800.0000 mg | DELAYED_RELEASE_TABLET | Freq: Two times a day (BID) | ORAL | 3 refills | Status: DC

## 2020-09-08 ENCOUNTER — Ambulatory Visit (INDEPENDENT_AMBULATORY_CARE_PROVIDER_SITE_OTHER): Payer: 59 | Admitting: Gastroenterology

## 2020-09-28 NOTE — Progress Notes (Signed)
Patient profile: Aaron Phillips is a 49 y.o. male seen for follow-up. Last seen May 2021 for colonoscopy. History of Crohn's disease dating back to age 51.  He has been on balsalazide in the past but had trouble remembering the 9 pills a day.  He had a course of budesonide taper in March 2021 and began mesalamine 859m BID.  History of Present Illness: Aaron HHaldermanis seen today for follow up. Reports he is doing very well since his colonoscopy in May. He is typically having a bowel movement 1-2 times a day. Consistency varies but these are usually formed and no significant diarrhea. He has no abdominal pain or blood in his stool.  He denies any nausea vomiting frequently. Very occasional GERD symptoms and takes Protonix 40 mg on an as-needed basis. Tries to avoid spicy foods, late meals but does have a lot of caffeine if works 24-hour shifts. Infrequently uses NSAIDs once a month. Alcohol twice a month. Does smoke 1PPD.   Trying to lose weight.  Wt Readings from Last 3 Encounters:  09/29/20 227 lb 14.4 oz (103.4 kg)  03/08/20 235 lb 3.2 oz (106.7 kg)  02/22/20 236 lb (107 kg)     Last Colonoscopy: 04/2020 - A few erosions in the terminal ileum. Biopsied. - Congested mucosa with few scattered ersions in the sigmoid colon, in the descending colon, at the splenic flexure, in the transverse colon, at the hepatic flexure and in the ascending colon. Biopsied. - Diverticulosis at the splenic flexure. - External and internal hemorrhoids  A. TERMINAL ILEUM, BIOPSY:  - Minimally active colitis.  Biopsies from sigmoid, rectum, left colon, hepatic flexure, right colon with unremarkable colorectal type mucosa.  PATH-Biopsy results reviewed with patient. Ileal biopsy shows mild disease.  Colonic biopsies are normal. I wonder diverticula are super diverticular due to healed colitis. We will start patient on Asacol 800 mg twice daily and dicyclomine 10 mg 2-3 times a day.    Last  Endoscopy: None prior)   Past Medical History:  Past Medical History:  Diagnosis Date  . Anxiety   . Crohn's disease ([place]    As a child he was dx.  . Crohn's disease ([facility]     Problem List: Patient Active Problem List   Diagnosis Date Noted  . Crohn's disease of both small and large intestine ([place] 03/08/2020  . Leukocytosis 02/22/2020  . Depression 11/24/2015  . Chronic diarrhea 11/24/2015  . History of Crohn's disease 11/24/2015    Past Surgical History: Past Surgical History:  Procedure Laterality Date  . BIOPSY  04/28/2020   Procedure: BIOPSY;  Surgeon: RRogene Houston MD;  [facility];  Service: Endoscopy;;  . COLONOSCOPY N/A 12/09/2015   Procedure: COLONOSCOPY;  Surgeon: NRogene Houston MD;  [facility];  Service: Endoscopy;  Laterality: N/A;  230  . COLONOSCOPY N/A 04/28/2020   Procedure: COLONOSCOPY;  Surgeon: RRogene Houston MD;  [facility];  Service: Endoscopy;  Laterality: N/A;  200  . INGUINAL HERNIA REPAIR  2017    Allergies: No Known Allergies    Home Medications:  Current Outpatient Medications:  .Marland Kitchen Melatonin 3 MG CAPS, Take 3 mg by mouth at bedtime as needed (sleep)., Disp: , Rfl:  .  Mesalamine 800 MG TBEC, Take 1 tablet (800 mg total) by mouth in the morning and at bedtime., Disp: 60 tablet, Rfl: 11 .  pantoprazole (PROTONIX) 40 MG tablet, TAKE 1 TABLET(40 MG) BY MOUTH  DAILY, Disp: 90 tablet, Rfl: 3 .  budesonide (ENTOCORT EC) 3 MG 24 hr capsule, Take 1 capsule (3 mg total) by mouth daily. (Patient not taking: Reported on 09/29/2020), Disp: 90 capsule, Rfl: 1 .  dicyclomine (BENTYL) 10 MG capsule, Take 1 capsule (10 mg total) by mouth 3 (three) times daily before meals. (Patient not taking: Reported on 09/29/2020), Disp: 90 capsule, Rfl: 5 .  hydrocortisone (ANUSOL-HC) 25 MG suppository, Place 1 suppository (25 mg total) rectally at bedtime. (Patient not taking: Reported on 09/29/2020), Disp: 14 suppository, Rfl: 1 .   ibuprofen (ADVIL) 200 MG tablet, Take 800 mg by mouth daily as needed for headache or moderate pain. (Patient not taking: Reported on 09/29/2020), Disp: , Rfl:  .  Pseudoephedrine HCl (SUDAFED SINUS CONGESTION 24HR) 240 MG TB24, Take 240 mg by mouth daily as needed (allergies). (Patient not taking: Reported on 09/29/2020), Disp: , Rfl:  .  valACYclovir (VALTREX) 500 MG tablet, Take 500 mg by mouth daily as needed (fever blisters).  (Patient not taking: Reported on 09/29/2020), Disp: , Rfl:    Family History: family history includes Hypertension in his father and mother; Yves Dill Parkinson White syndrome in his sister.    Social History:   reports that he has been smoking. He uses smokeless tobacco. He reports current alcohol use. He reports that he does not use drugs.   Review of Systems: Constitutional: + trying to loose weight Eyes: No changes in vision. ENT: No oral lesions, sore throat.  GI: see HPI.  Heme/Lymph: No easy bruising.  CV: No chest pain.  GU: No hematuria.  Integumentary: No rashes.  Neuro: No headaches.  Psych: No depression/anxiety.  Endocrine: No heat/cold intolerance.  Allergic/Immunologic: No urticaria.  Resp: No cough, SOB.  Musculoskeletal: No joint swelling.    Physical Examination: BP 127/78 (BP Location: Right Arm, Patient Position: Sitting, Cuff Size: Normal)   Pulse 90   Temp (!) 97 F (36.1 C) (Temporal)   Ht _0  (1.88 m)   Wt 227 lb 14.4 oz (103.4 kg)   BMI 29.26 kg/m  Gen: NAD, alert and oriented x 4 HEENT: PEERLA, EOMI, Neck: supple, no JVD Chest: CTA bilaterally, no wheezes, crackles, or other adventitious sounds CV: RRR, no m/g/c/r Abd: soft, NT, ND, +BS in all four quadrants; no HSM, guarding, ridigity, or rebound tenderness Ext: no edema, well perfused with 2+ pulses, Skin: no rash or lesions noted on observed skin Lymph: no noted LAD  Data Reviewed:  02/2020-SED rate 18, CRP 1.5, negative rheumatoid factor, CBC w/ WBC 12.7     Assessment/Plan: Mr. Mickelson is a 49 y.o. male with history of Crohn's disease  1. Crohn's-symptomatically in clinical remission with mesalamine 800 mg twice daily. Pentasa was not covered by insurance. He is currently taking dicyclomine once a day, reviewed he can switch to as needed given he is doing very well without any GI sx. We will repeat his basic labs today. We did discuss decreasing his daily tobacco intake.  2. GERD - uses PPI on PRN basis. No UGI alarm symptoms.   F/up 1 year if doing well. Call sooner if needed   Karina was seen today for crohn's disease.  Diagnoses and all orders for this visit:  Crohn's disease of colon without complication ([place]) -     CBC with Differential -     COMPLETE METABOLIC PANEL WITH GFR -     C-reactive protein -     Sed Rate (ESR)  Gastroesophageal reflux disease  without esophagitis -     pantoprazole (PROTONIX) 40 MG tablet; TAKE 1 TABLET(40 MG) BY MOUTH DAILY -     CBC with Differential -     COMPLETE METABOLIC PANEL WITH GFR -     C-reactive protein -     Sed Rate (ESR)  Other orders -     Mesalamine 800 MG TBEC; Take 1 tablet (800 mg total) by mouth in the morning and at bedtime.     I personally performed the service, non-incident to. (WP)  Laurine Blazer, [facility]

## 2020-09-29 ENCOUNTER — Ambulatory Visit (INDEPENDENT_AMBULATORY_CARE_PROVIDER_SITE_OTHER): Payer: 59 | Admitting: Gastroenterology

## 2020-09-29 ENCOUNTER — Other Ambulatory Visit: Payer: Self-pay

## 2020-09-29 ENCOUNTER — Encounter (INDEPENDENT_AMBULATORY_CARE_PROVIDER_SITE_OTHER): Payer: Self-pay | Admitting: Gastroenterology

## 2020-09-29 VITALS — BP 127/78 | HR 90 | Temp 97.0°F | Ht 74.0 in | Wt 227.9 lb

## 2020-09-29 DIAGNOSIS — K5010 Crohn's disease of large intestine without complications: Secondary | ICD-10-CM | POA: Diagnosis not present

## 2020-09-29 DIAGNOSIS — K219 Gastro-esophageal reflux disease without esophagitis: Secondary | ICD-10-CM | POA: Diagnosis not present

## 2020-09-29 MED ORDER — MESALAMINE 800 MG PO TBEC: 800.0000 mg | DELAYED_RELEASE_TABLET | Freq: Two times a day (BID) | ORAL | 11 refills | Status: DC

## 2020-09-29 MED ORDER — PANTOPRAZOLE SODIUM 40 MG PO TBEC: DELAYED_RELEASE_TABLET | ORAL | 3 refills | Status: DC

## 2020-09-29 NOTE — Patient Instructions (Addendum)
-  Check if Lialda or Apriso are a lower tier on formulary  -Try to decrease tobacco intake   GERD instructions: -Please avoid lying flat within 2 to 3 hours of eating, this will make reflux symptoms worse. -Some patients find elevating the head of the bed beneficial. -Avoid spicy greasy foods as well as caffeine, coffee, sodas-these food/drinks can worsen heartburn and reflux. -Tobacco will worsen reflux, please try to decrease/eliminate tobacco intake if applicable. -Avoid NSAID products (ibuprofen, aspirin, Advil, Aleve, Goody's, BCs, Alka-Seltzer) - if needing these occasionally please try to take with meal or snack to decrease stomach irritation. -If taking medication for reflux such as prilosec, nexium, aciphex, dexilant, prevacid - take 20-30 minutes before a meal for maximum effectiveness.

## 2020-09-30 LAB — COMPLETE METABOLIC PANEL WITH GFR
AG Ratio: 1.3 (calc) (ref 1.0–2.5)
ALT: 17 U/L (ref 9–46)
AST: 18 U/L (ref 10–40)
Albumin: 4.4 g/dL (ref 3.6–5.1)
Alkaline phosphatase (APISO): 78 U/L (ref 36–130)
BUN: 9 mg/dL (ref 7–25)
CO2: 24 mmol/L (ref 20–32)
Calcium: 9.7 mg/dL (ref 8.6–10.3)
Chloride: 100 mmol/L (ref 98–110)
Creat: 0.92 mg/dL (ref 0.60–1.35)
GFR, Est African American: 114 mL/min/{1.73_m2} (ref >=60)
GFR, Est Non African American: 98 mL/min/{1.73_m2} (ref >=60)
Globulin: 3.3 g/dL (calc) (ref 1.9–3.7)
Glucose, Bld: 81 mg/dL (ref 65–99)
Potassium: 4.4 mmol/L (ref 3.5–5.3)
Sodium: 137 mmol/L (ref 135–146)
Total Bilirubin: 0.6 mg/dL (ref 0.2–1.2)
Total Protein: 7.7 g/dL (ref 6.1–8.1)

## 2020-09-30 LAB — CBC WITH DIFFERENTIAL/PLATELET
Absolute Monocytes: 761 cells/uL (ref 200–950)
Basophils Absolute: 138 cells/uL (ref 0–200)
Basophils Relative: 0.8 %
Eosinophils Absolute: 208 cells/uL (ref 15–500)
Eosinophils Relative: 1.2 %
HCT: 47.9 % (ref 38.5–50.0)
Hemoglobin: 16.6 g/dL (ref 13.2–17.1)
Lymphs Abs: 2301 cells/uL (ref 850–3900)
MCH: 31.7 pg (ref 27.0–33.0)
MCHC: 34.7 g/dL (ref 32.0–36.0)
MCV: 91.4 fL (ref 80.0–100.0)
MPV: 10.2 fL (ref 7.5–12.5)
Monocytes Relative: 4.4 %
Neutro Abs: 13892 cells/uL — ABNORMAL HIGH (ref 1500–7800)
Neutrophils Relative %: 80.3 %
Platelets: 357 10*3/uL (ref 140–400)
RBC: 5.24 10*6/uL (ref 4.20–5.80)
RDW: 12.8 % (ref 11.0–15.0)
Total Lymphocyte: 13.3 %
WBC: 17.3 10*3/uL — ABNORMAL HIGH (ref 3.8–10.8)

## 2020-09-30 LAB — C-REACTIVE PROTEIN: CRP: 8.7 mg/L — ABNORMAL HIGH (ref <8.0)

## 2020-09-30 LAB — SEDIMENTATION RATE: Sed Rate: 9 mm/h (ref 0–15)

## 2021-03-20 ENCOUNTER — Other Ambulatory Visit (INDEPENDENT_AMBULATORY_CARE_PROVIDER_SITE_OTHER): Payer: Self-pay | Admitting: Internal Medicine

## 2021-10-03 ENCOUNTER — Ambulatory Visit (INDEPENDENT_AMBULATORY_CARE_PROVIDER_SITE_OTHER): Payer: 59 | Admitting: Internal Medicine

## 2021-10-03 ENCOUNTER — Encounter (INDEPENDENT_AMBULATORY_CARE_PROVIDER_SITE_OTHER): Payer: Self-pay | Admitting: Internal Medicine

## 2022-01-09 ENCOUNTER — Ambulatory Visit (INDEPENDENT_AMBULATORY_CARE_PROVIDER_SITE_OTHER): Payer: 59 | Admitting: Internal Medicine

## 2022-01-09 ENCOUNTER — Observation Stay (HOSPITAL_COMMUNITY): Payer: 59

## 2022-01-09 ENCOUNTER — Encounter (INDEPENDENT_AMBULATORY_CARE_PROVIDER_SITE_OTHER): Payer: Self-pay | Admitting: Internal Medicine

## 2022-01-09 ENCOUNTER — Inpatient Hospital Stay (HOSPITAL_COMMUNITY)
Admission: AD | Admit: 2022-01-09 | Discharge: 2022-01-11 | DRG: 348 | Disposition: A | Discharge status: Home / Self Care | Payer: 59 | Source: Ambulatory Visit | Attending: Internal Medicine | Admitting: Internal Medicine

## 2022-01-09 ENCOUNTER — Encounter (HOSPITAL_COMMUNITY): Payer: Self-pay | Admitting: Internal Medicine

## 2022-01-09 ENCOUNTER — Other Ambulatory Visit: Payer: Self-pay

## 2022-01-09 VITALS — BP 117/83 | HR 93 | Temp 98.6°F | Ht 74.0 in | Wt 235.0 lb

## 2022-01-09 DIAGNOSIS — K610 Anal abscess: Secondary | ICD-10-CM

## 2022-01-09 DIAGNOSIS — K50819 Crohn's disease of both small and large intestine with unspecified complications: Secondary | ICD-10-CM | POA: Diagnosis not present

## 2022-01-09 DIAGNOSIS — K219 Gastro-esophageal reflux disease without esophagitis: Secondary | ICD-10-CM

## 2022-01-09 DIAGNOSIS — K50913 Crohn's disease, unspecified, with fistula: Secondary | ICD-10-CM | POA: Diagnosis present

## 2022-01-09 DIAGNOSIS — K50911 Crohn's disease, unspecified, with rectal bleeding: Secondary | ICD-10-CM | POA: Diagnosis present

## 2022-01-09 DIAGNOSIS — Z20822 Contact with and (suspected) exposure to covid-19: Secondary | ICD-10-CM | POA: Diagnosis present

## 2022-01-09 DIAGNOSIS — Z8719 Personal history of other diseases of the digestive system: Secondary | ICD-10-CM | POA: Diagnosis not present

## 2022-01-09 DIAGNOSIS — R739 Hyperglycemia, unspecified: Secondary | ICD-10-CM | POA: Diagnosis present

## 2022-01-09 DIAGNOSIS — K603 Anal fistula, unspecified: Secondary | ICD-10-CM | POA: Diagnosis present

## 2022-01-09 DIAGNOSIS — F32A Depression, unspecified: Secondary | ICD-10-CM | POA: Diagnosis present

## 2022-01-09 DIAGNOSIS — L03317 Cellulitis of buttock: Secondary | ICD-10-CM | POA: Diagnosis present

## 2022-01-09 DIAGNOSIS — F17210 Nicotine dependence, cigarettes, uncomplicated: Secondary | ICD-10-CM | POA: Diagnosis present

## 2022-01-09 DIAGNOSIS — Z79899 Other long term (current) drug therapy: Secondary | ICD-10-CM | POA: Diagnosis not present

## 2022-01-09 DIAGNOSIS — Z720 Tobacco use: Secondary | ICD-10-CM | POA: Diagnosis not present

## 2022-01-09 LAB — COMPREHENSIVE METABOLIC PANEL
ALT: 14 U/L (ref 0–44)
AST: 15 U/L (ref 15–41)
Albumin: 3.7 g/dL (ref 3.5–5.0)
Alkaline Phosphatase: 79 U/L (ref 38–126)
Anion gap: 10 (ref 5–15)
BUN: 15 mg/dL (ref 6–20)
CO2: 22 mmol/L (ref 22–32)
Calcium: 8.9 mg/dL (ref 8.9–10.3)
Chloride: 102 mmol/L (ref 98–111)
Creatinine, Ser: 0.83 mg/dL (ref 0.61–1.24)
GFR, Estimated: >60 mL/min (ref >60)
Glucose, Bld: 102 mg/dL — ABNORMAL HIGH (ref 70–99)
Potassium: 3.7 mmol/L (ref 3.5–5.1)
Sodium: 134 mmol/L — ABNORMAL LOW (ref 135–145)
Total Bilirubin: 0.8 mg/dL (ref 0.3–1.2)
Total Protein: 8 g/dL (ref 6.5–8.1)

## 2022-01-09 LAB — CBC
HCT: 41.8 % (ref 39.0–52.0)
Hemoglobin: 14.3 g/dL (ref 13.0–17.0)
MCH: 31.4 pg (ref 26.0–34.0)
MCHC: 34.2 g/dL (ref 30.0–36.0)
MCV: 91.9 fL (ref 80.0–100.0)
Platelets: 441 10*3/uL — ABNORMAL HIGH (ref 150–400)
RBC: 4.55 MIL/uL (ref 4.22–5.81)
RDW: 12.6 % (ref 11.5–15.5)
WBC: 15.9 10*3/uL — ABNORMAL HIGH (ref 4.0–10.5)
nRBC: 0 % (ref 0.0–0.2)

## 2022-01-09 LAB — HIV ANTIBODY (ROUTINE TESTING W REFLEX): HIV Screen 4th Generation wRfx: NONREACTIVE

## 2022-01-09 LAB — C-REACTIVE PROTEIN: CRP: 6.4 mg/dL — ABNORMAL HIGH (ref <1.0)

## 2022-01-09 LAB — RESP PANEL BY RT-PCR (FLU A&B, COVID) ARPGX2
Influenza A by PCR: NEGATIVE
Influenza B by PCR: NEGATIVE
SARS Coronavirus 2 by RT PCR: NEGATIVE

## 2022-01-09 LAB — SEDIMENTATION RATE: Sed Rate: 70 mm/hr — ABNORMAL HIGH (ref 0–16)

## 2022-01-09 IMAGING — MR MR PELVIS WO/W CM
21 of 22 series · 46 of 48 positions shown · IV contrast (10 ml Gadavist)
Comparison: None.

CLINICAL DATA: History of Crohn's disease, blood and pus per
rectum, probable perianal abscess

EXAM:
MRI PELVIS WITHOUT AND WITH CONTRAST
TECHNIQUE: Multiplanar multisequence MR imaging of the pelvis was performed
both before and after administration of intravenous contrast.
CONTRAST:  10mL GADAVIST GADOBUTROL 1 MMOL/ML IV SOLN

[Series 3: T2 · sagittal · 2.5mm · 0.81mm/px · 3 of 35 slices shown (1 of 3)]
[im 1/35]
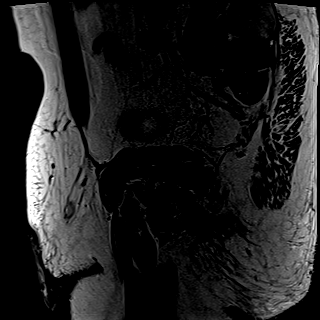
[im 18/35]
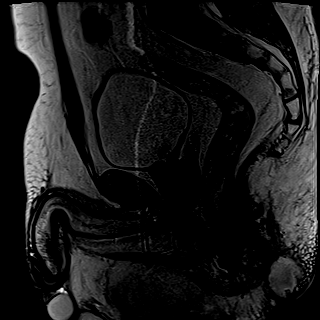
[im 35/35]
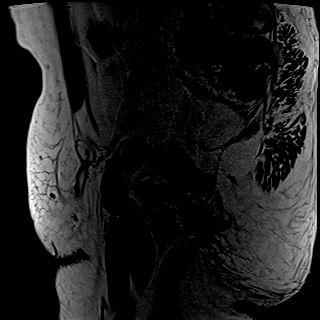

[Series 4: T2 · sagittal · 2.5mm · 0.81mm/px · 3 of 35 slices shown (2 of 3)]
[im 1/35]
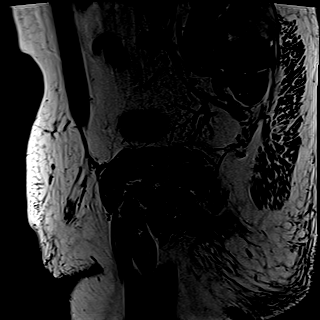
[im 18/35]
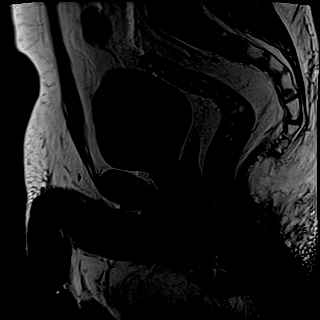
[im 35/35]
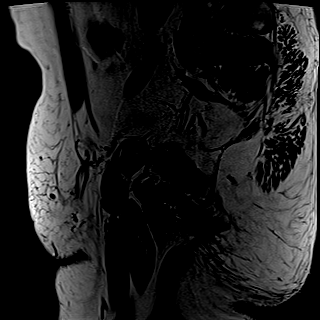

[Series 5: T2 · sagittal · 2.5mm · 0.81mm/px · 3 of 35 slices shown (3 of 3)]
[im 1/35]
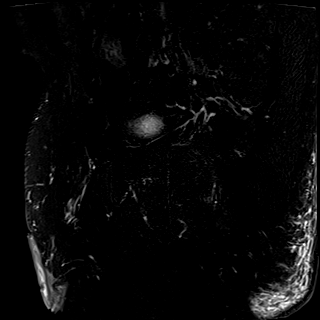
[im 18/35]
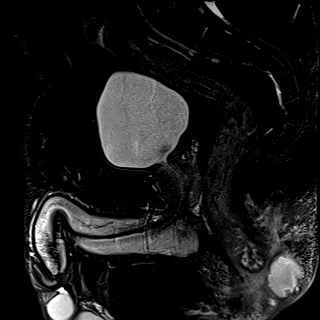
[im 35/35]
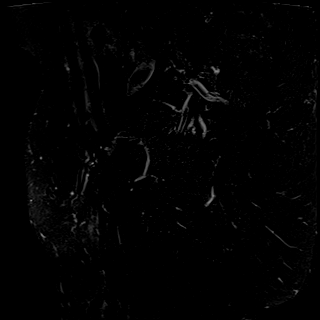

[Series 9: DIXON · axial · 4.0mm · 0.69mm/px · z∈[-97,+27]mm · 3 of 30 slices shown (1 of 9)]
[im 1/30]
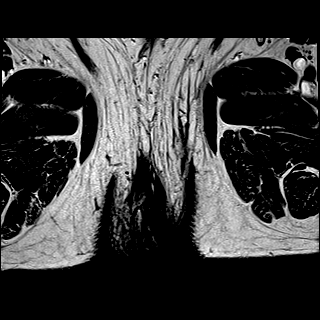
[im 15/30]
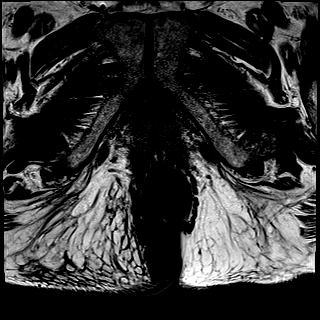
[im 30/30]
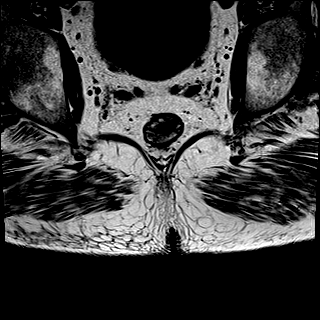

[Series 10: DIXON · axial · 4.0mm · 0.69mm/px · z∈[-97,+27]mm · 2 of 30 slices shown (2 of 9)]
[im 1/30]
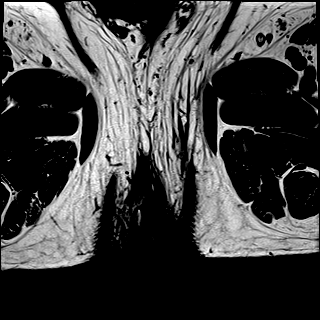
[im 30/30]
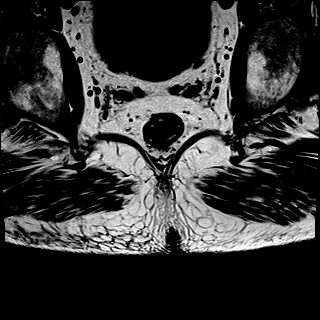

[Series 11: DIXON · axial · 4.0mm · 0.69mm/px · z∈[-97,+27]mm · 2 of 30 slices shown (3 of 9)]
[im 1/30]
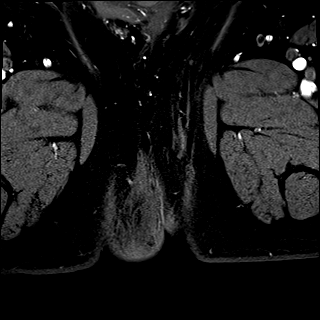
[im 30/30]
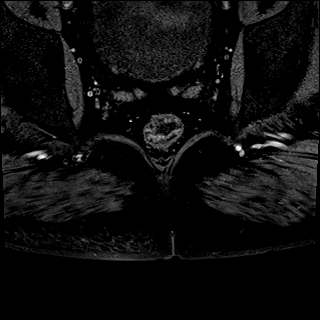

[Series 12: DIXON · axial · 4.0mm · 0.69mm/px · z∈[-97,+27]mm · 2 of 30 slices shown (4 of 9)]
[im 1/30]
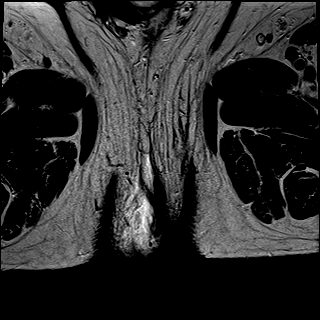
[im 30/30]
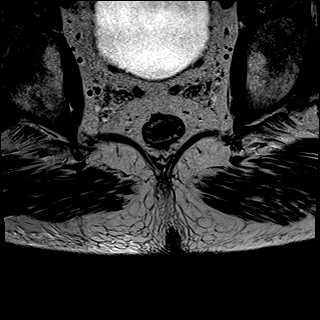

[Series 13: DIXON · axial · 4.0mm · 0.69mm/px · z∈[-97,+27]mm · 2 of 30 slices shown (5 of 9)]
[im 1/30]
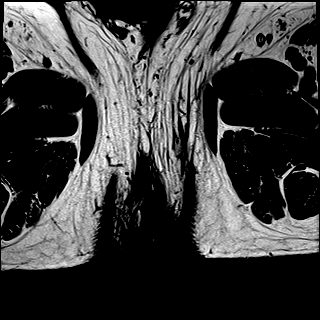
[im 30/30]
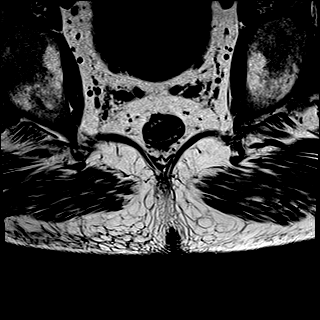

[Series 14: DIXON · axial · 4.0mm · 0.69mm/px · z∈[-97,+27]mm · 2 of 30 slices shown (6 of 9)]
[im 1/30]
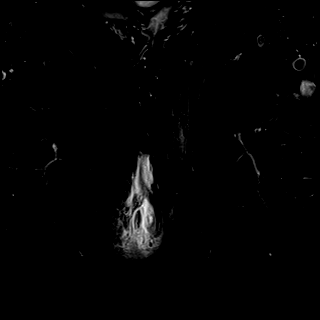
[im 30/30]
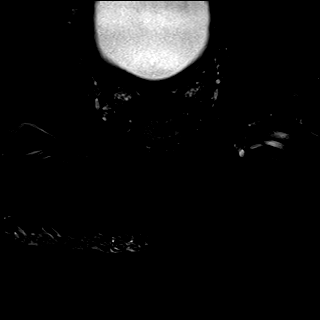

[Series 15: DIXON · oblique · 4.0mm · 0.78mm/px · 2 of 37 slices shown (7 of 9)]
[im 1/37]
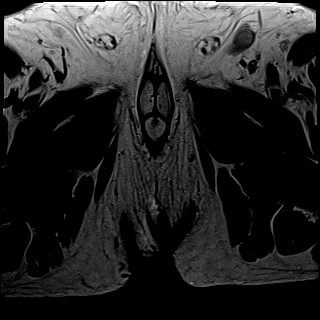
[im 37/37]
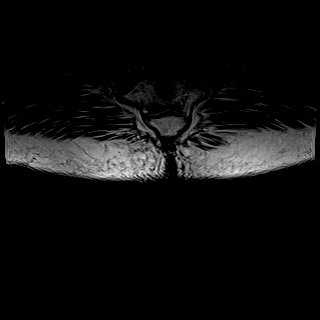

[Series 16: DIXON · oblique · 4.0mm · 0.78mm/px · 2 of 37 slices shown (8 of 9)]
[im 1/37]
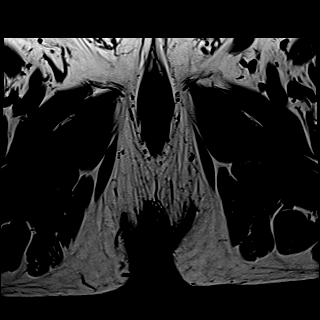
[im 37/37]
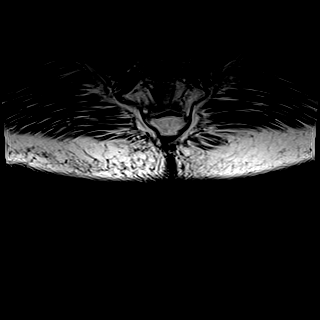

[Series 17: DIXON · oblique · 4.0mm · 0.78mm/px · 2 of 37 slices shown (9 of 9)]
[im 1/37]
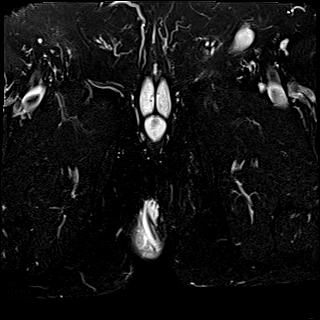
[im 37/37]
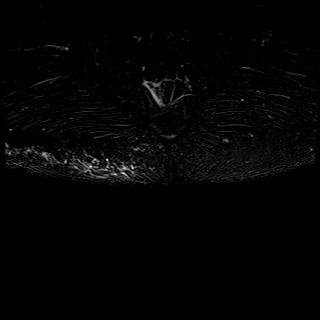

[Series 18: DWI · axial · 8.0mm · 1.41mm/px · z∈[-178,+112]mm · 2 of 30 slices shown (1 of 2)]
[im 1/30]
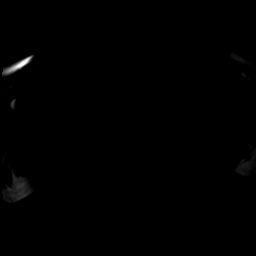
[im 30/30]
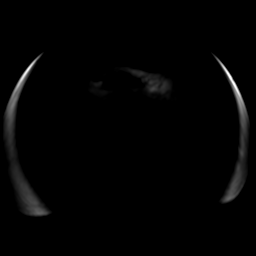

[Series 18: DWI · axial · 8.0mm · 1.41mm/px · z∈[-178,+112]mm · 2 of 30 slices shown (2 of 2)]
[im 1/30]
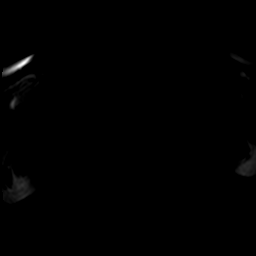
[im 30/30]
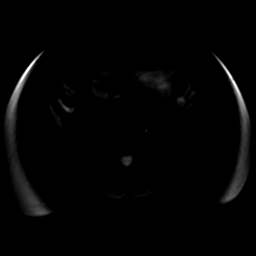

[Series 19: ax dwi_adc · axial · 8.0mm · 1.41mm/px · z∈[-178,+112]mm · 2 of 30 slices shown]
[im 1/30]
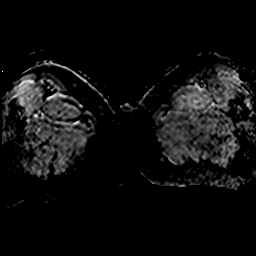
[im 30/30]
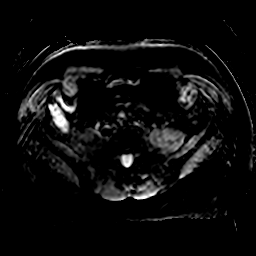

[Series 20: ax dwi_calc_bval · axial · 8.0mm · 1.41mm/px · z∈[-178,+112]mm · 2 of 30 slices shown]
[im 1/30]
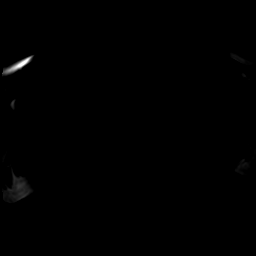
[im 30/30]
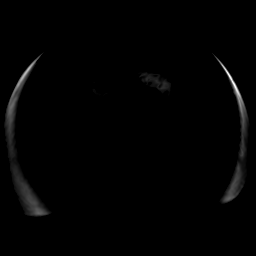

[Series 21: DIXON post-contrast · oblique · 4.0mm · 0.78mm/px · 2 of 37 slices shown (1 of 5)]
[im 1/37]
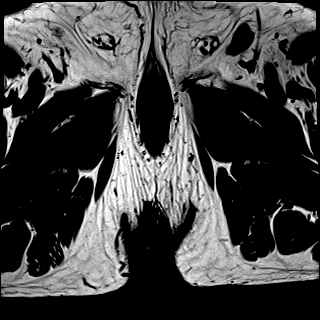
[im 37/37]
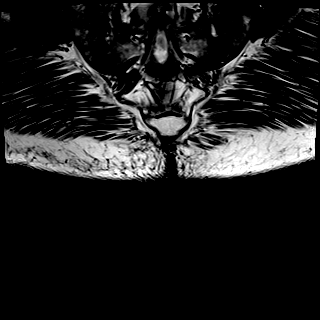

[Series 22: DIXON post-contrast · oblique · 4.0mm · 0.78mm/px · 2 of 37 slices shown (2 of 5)]
[im 1/37]
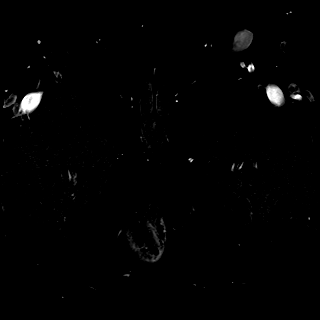
[im 37/37]
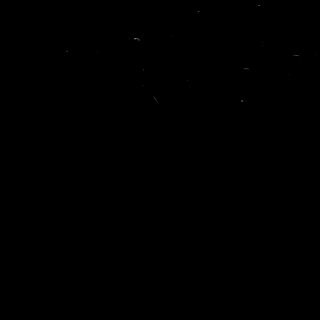

[Series 23: DIXON post-contrast · axial · 4.0mm · 0.69mm/px · z∈[-97,+27]mm · 2 of 29 slices shown (3 of 5)]
[im 1/29]
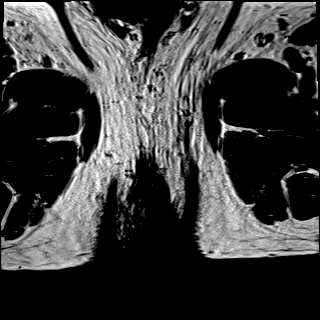
[im 29/29]
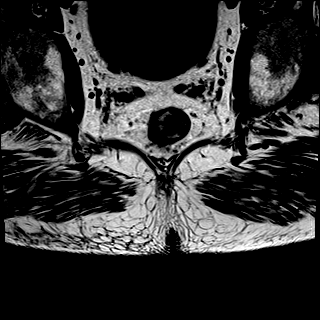

[Series 24: DIXON post-contrast · axial · 4.0mm · 0.69mm/px · z∈[-97,+27]mm · 2 of 30 slices shown (4 of 5)]
[im 1/30]
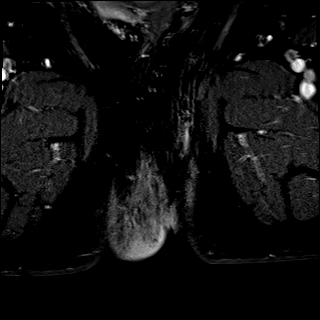
[im 30/30]
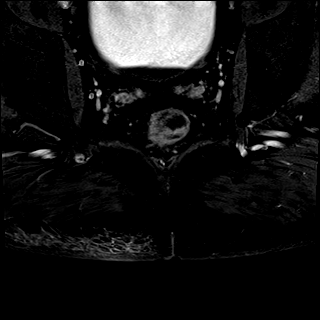

[Series 25: DIXON post-contrast · coronal · 4.0mm · 0.78mm/px · 2 of 37 slices shown (5 of 5)]
[im 1/37]
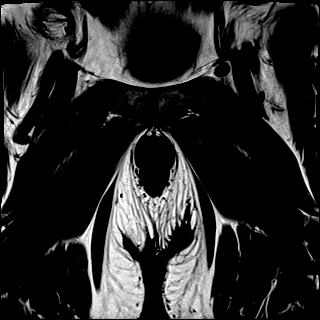
[im 37/37]
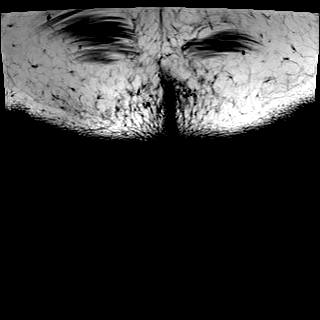

[46 of 48 positions shown; findings below may reference images not displayed]

FINDINGS: Urinary Tract:  No abnormality visualized.

Bowel: There is mild, circumferential thickening and
hyperenhancement of the included rectal mucosa. Otherwise
unremarkable visualized pelvic bowel loops, mostly seen on select,
limited sequences. There is a right intersphincteric anal fistula
arising at 7 o'clock face in lithotomy position approximately 2 cm
above the anal verge (series 22, image 16, series 24, image 15) and
extending posteriorly to a subcutaneous rim enhancing abscess at the
right aspect of the gluteal cleft measuring 2.9 x 2.0 cm (series 24,
image 16), with a probable skin insertion site somewhat further
posterior to the abscess cavity. Very extensive subcutaneous edema
and inflammatory fat stranding generally limits assessment of the
fistula tract and skin insertion site.

Vascular/Lymphatic: No pathologically enlarged lymph nodes. No
significant vascular abnormality seen.

Reproductive:  No mass or other significant abnormality

Other:  None.

Musculoskeletal: No suspicious bone lesions identified.
IMPRESSION: 1. Right sided intersphincteric anal fistula arising at 7 o'clock
face in lithotomy position approximately 2 cm above the anal verge.
2. Fistula tract is complicated by a 2.9 cm subcutaneous abscess at
the right aspect of the gluteal cleft, with a probable skin
insertion site somewhat further posterior to the abscess cavity.
3. Very extensive subcutaneous edema and inflammatory fat stranding
generally limits assessment of the fistula tract and skin insertion
site.
4. No intrapelvic abscess.
5. Mild, circumferential thickening and hyperenhancement of the
included rectal mucosa, in keeping with reported history of Crohn's
disease.

## 2022-01-09 MED ORDER — ACETAMINOPHEN 325 MG PO TABS
650.0000 mg | ORAL_TABLET | Freq: Four times a day (QID) | ORAL | Status: DC | PRN
  Administered 2022-01-09 – 2022-01-10 (×2): 650 mg via ORAL
  Filled 2022-01-09 (×2): qty 2

## 2022-01-09 MED ORDER — PIPERACILLIN-TAZOBACTAM 3.375 G IVPB
3.3750 g | Freq: Three times a day (TID) | INTRAVENOUS | Status: DC
  Administered 2022-01-09 – 2022-01-11 (×5): 3.375 g via INTRAVENOUS
  Filled 2022-01-09 (×4): qty 50

## 2022-01-09 MED ORDER — MESALAMINE 400 MG PO CPDR
800.0000 mg | DELAYED_RELEASE_CAPSULE | Freq: Two times a day (BID) | ORAL | Status: DC
  Administered 2022-01-09 – 2022-01-11 (×2): 800 mg via ORAL
  Filled 2022-01-09 (×6): qty 2

## 2022-01-09 MED ORDER — ACETAMINOPHEN 650 MG RE SUPP: 650.0000 mg | Freq: Four times a day (QID) | RECTAL | Status: DC | PRN

## 2022-01-09 MED ORDER — CHLORHEXIDINE GLUCONATE CLOTH 2 % EX PADS
6.0000 | MEDICATED_PAD | Freq: Once | CUTANEOUS | Status: AC
  Administered 2022-01-10: 6 via TOPICAL

## 2022-01-09 MED ORDER — TRAMADOL HCL 50 MG PO TABS
50.0000 mg | ORAL_TABLET | Freq: Four times a day (QID) | ORAL | Status: DC | PRN
  Administered 2022-01-09 – 2022-01-11 (×5): 50 mg via ORAL
  Filled 2022-01-09 (×5): qty 1

## 2022-01-09 MED ORDER — GADOBUTROL 1 MMOL/ML IV SOLN
10.0000 mL | Freq: Once | INTRAVENOUS | Status: AC | PRN
  Administered 2022-01-09: 10 mL via INTRAVENOUS

## 2022-01-09 MED ORDER — NICOTINE 14 MG/24HR TD PT24
14.0000 mg | MEDICATED_PATCH | Freq: Every day | TRANSDERMAL | Status: DC
  Administered 2022-01-09 – 2022-01-11 (×3): 14 mg via TRANSDERMAL
  Filled 2022-01-09 (×4): qty 1

## 2022-01-09 NOTE — Progress Notes (Signed)
[facility]  Reviewed imaging. Will post for AM. NPO Midnight for perianal abscess drainage and seton placement for fistula. Will discuss with patient in AM.   Curlene Labrum, MD [facility] [address] [place], [place] [address] [phone] (office)

## 2022-01-09 NOTE — H&P (Addendum)
History and Physical    Aaron Phillips WPY:[id number] DOB: 03/26/1971 DOA: 01/09/2022  PCP: Redmond School, MD   Patient coming from: Home  I have personally briefly reviewed patient's old medical records in [place]  Chief Complaint: Rectal pain bleeding and pus from rectum  HPI: Aaron Phillips is a 51 y.o. male with medical history significant for Crohn's disease, depression. Patient presented to the ED with complaints of rectal pain, with drainage of blood and pus from his rectum.  He went to see his gastroenterologist today, it was noted that he had enlargement of his right buttock, the perianal skin was tender and indurated.  He was subsequently directly admitted from the clinic to the hospital.  He otherwise denies any fevers or chills, no abdominal pain.  No vomiting no diarrhea.  No cough no difficulty breathing.  Review of Systems: As per HPI all other systems reviewed and negative.  Past Medical History:  Diagnosis Date   Anxiety    Crohn's disease ([place])    As a child he was dx.   Crohn's disease [facility])     Past Surgical History:  Procedure Laterality Date   BIOPSY  04/28/2020   Procedure: BIOPSY;  Surgeon: Rogene Houston, MD;  [facility];  Service: Endoscopy;;   COLONOSCOPY N/A 12/09/2015   Procedure: COLONOSCOPY;  Surgeon: Rogene Houston, MD;  [facility];  Service: Endoscopy;  Laterality: N/A;  230   COLONOSCOPY N/A 04/28/2020   Procedure: COLONOSCOPY;  Surgeon: Rogene Houston, MD;  [facility];  Service: Endoscopy;  Laterality: N/A;  200   INGUINAL HERNIA REPAIR  2017     reports that he has been smoking cigarettes. His smokeless tobacco use includes chew. He reports current alcohol use. He reports that he does not use drugs.  No Known Allergies  Family History  Problem Relation Age of Onset   Hypertension Mother    Hypertension Father    [name] syndrome Sister     Prior to Admission  medications   Medication Sig Start Date End Date Taking? Authorizing Provider  dicyclomine (BENTYL) 10 MG capsule TAKE 1 CAPSULE(10 MG) BY MOUTH THREE TIMES DAILY BEFORE MEALS 03/20/21   Montez Morita, Quillian Quince, MD  ibuprofen (ADVIL) 200 MG tablet Take 800 mg by mouth daily as needed for headache or moderate pain.    [provider]  Melatonin 3 MG CAPS Take 3 mg by mouth at bedtime as needed (sleep).    [provider]  Mesalamine 800 MG TBEC Take 1 tablet (800 mg total) by mouth in the morning and at bedtime. 09/29/20   Laurine Blazer B, PA-C  pantoprazole (PROTONIX) 40 MG tablet TAKE 1 TABLET(40 MG) BY MOUTH DAILY 09/29/20   Laurine Blazer B, PA-C  valACYclovir (VALTREX) 500 MG tablet Take 500 mg by mouth daily as needed (fever blisters). 01/23/20   [provider]    Physical Exam: Vitals:   01/09/22 1605 01/09/22 1700  BP: (!) 133/97   Pulse: 93   Resp: 18   Temp: 97.9 F (36.6 C)   TempSrc: Oral   SpO2: 97%   Weight:  106.3 kg  Height:  _0  (1.88 m)    Constitutional: NAD, calm, comfortable Eyes: PERRL, lids and conjunctivae normal ENMT: Mucous membranes are moist.  Neck: normal, supple, no masses, no thyromegaly Respiratory: clear to auscultation bilaterally, no wheezing, no crackles. Normal respiratory effort. No accessory muscle use.  Cardiovascular:  Regular rate and rhythm, no murmurs / rubs / gallops. No extremity edema. 2+ pedal pulses. No carotid bruits.  Abdomen: Sitting on his right hip, no tenderness, no masses palpated. No hepatosplenomegaly. Bowel sounds positive.  Musculoskeletal: no clubbing / cyanosis. No joint deformity upper and lower extremities. Good ROM, no contractures. Normal muscle tone.  Skin: no rashes, lesions, ulcers. No induration Neurologic: Apparent cranial nerve abnormality, moving extremities spontaneously. Psychiatric: Normal judgment and insight. Alert and oriented x 3. Normal mood.   Labs on Admission: I have  personally reviewed following labs and imaging studies  EKG: none  Assessment/Plan Principal Problem:   Perianal abscess Active Problems:   History of Crohn's disease   Anal fistula  Perianal pain-with drainage of pus and blood from rectum, concerning for perianal abscess.  Swelling of right foot.  Rules out for sepsis.  Leukocytosis of 15.9, but he has a chronic leukocytosis ranging from 12-33 over the past 5 years. - Saw Dr. Laural Golden today, directly admitted from clinic-requested MRI pelvis, ESR CRP and basic blood work. -IV Zosyn -BMP, CBC in the morning -MRI pelvis with and without contrast-shows right-sided intersphincteric anal fistula, complicated by 2.9 cm subcutaneous abscess.  Also shows very extensive subcutaneous edema. -General surgeon Dr. Constance Haw to see in the morning, n.p.o. midnight -Tramadol for pain, (patient request-avoiding stronger narcotics, does not like the way it makes him feel)  Crohn's disease-diagnosed at age 75, with indolent course.  Follows with Dr. Laural Golden. -ESR-elevated at 70, CRP pending -Resume home mesalamine.  Tobacco use -Nicotine patch  DVT prophylaxis: SCDs for now Code Status: Full code Family Communication: Fianc at bedside Disposition Plan:  ~ 2 days Consults called: Pending MRI results, may consult general surgery. Admission status: inpt, med surg I certify that at the point of admission it is my clinical judgment that the patient will require inpatient hospital care spanning beyond 2 midnights from the point of admission due to high intensity of service, high risk for further deterioration and high frequency of surveillance required.   Bethena Roys MD [facility]  01/09/2022, 8:33 PM

## 2022-01-09 NOTE — Progress Notes (Signed)
Pharmacy Antibiotic Note  Aaron Phillips is a 51 y.o. male admitted on 01/09/2022 who presented with a 2-week history of perianal drainage of blood and pus and concerns for an intra-abdominal infection. Pharmacy has been consulted for Zosyn dosing.  Plan: Start Zosyn 3.375g Q8H Follow-up cultures, clinical improvement  Height: 6\' 2"  (188 cm) Weight: 106.3 kg (234 lb 5.6 oz) IBW/kg (Calculated) : 82.2  Temp (24hrs), Avg:98.1 F (36.7 C), Min:97.9 F (36.6 C), Max:98.6 F (37 C)  Recent Labs  Lab 01/09/22 1620  WBC 15.9*  CREATININE 0.83    Estimated Creatinine Clearance: 139.8 mL/min (by C-G formula based on SCr of 0.83 mg/dL).    No Known Allergies   Thank you for allowing pharmacy to be a part of this patients care.  01/11/22, PharmD Clinical Pharmacist

## 2022-01-09 NOTE — Progress Notes (Signed)
Presenting complaint;  History of Crohn's disease. Patient complains of hemorrhoidal flare.  Database and subjective:  Patient is 51 year old Caucasian male who has history of Crohn's disease diagnosed at age 5 with indolent course.  He had colonoscopy in December 2016 revealing mild disease to terminal ileum and colon.  He has been maintained on oral mesalamine.-   Surveillance colonoscopy in May 2021 revealed few ileal erosions and biopsy confirmed  ileitis.  He had diverticula at hepatic and splenic flexure as well as transverse colon.  He had few scattered erosions involving colon as well as some scarring.  He also had internal and external hemorrhoids. Patient was last seen in the office and he was doing well.  His sed rate was 9, CRP was 1.5 was 8.7(upper limit of normal 8).  Chemistry panel was normal but CBC revealed leukocytosis but normal H&H.  We recommended repeating CBC and CRP in 2 months but it was not done.  Yearly appointment did not happen either.  Patient called earlier today requesting to be seen in setting he was having flareup of his hemorrhoids and was also having perianal pain.  He denied fever.  He noted worsening pain when he would sit up.  He has been noticing blood and pus.  He has been using Epson salt but it did not help.  He has 1-2 formed stools daily.  Stool is soft and formed.  Occasionally stools can be loose or hard.  He denies abdominal pain nausea or vomiting.  His appetite is good.  He has gained 10 pounds last year. He is taking is using pantoprazole on as-needed basis.   Current Medications: Outpatient Encounter Medications as of 01/09/2022  Medication Sig   dicyclomine (BENTYL) 10 MG capsule TAKE 1 CAPSULE(10 MG) BY MOUTH THREE TIMES DAILY BEFORE MEALS   ibuprofen (ADVIL) 200 MG tablet Take 800 mg by mouth daily as needed for headache or moderate pain.   Melatonin 3 MG CAPS Take 3 mg by mouth at bedtime as needed (sleep).   Mesalamine 800 MG TBEC Take 1  tablet (800 mg total) by mouth in the morning and at bedtime.   pantoprazole (PROTONIX) 40 MG tablet TAKE 1 TABLET(40 MG) BY MOUTH DAILY   valACYclovir (VALTREX) 500 MG tablet Take 500 mg by mouth daily as needed (fever blisters).   [DISCONTINUED] budesonide (ENTOCORT EC) 3 MG 24 hr capsule Take 1 capsule (3 mg total) by mouth daily. (Patient not taking: Reported on 09/29/2020)   [DISCONTINUED] hydrocortisone (ANUSOL-HC) 25 MG suppository Place 1 suppository (25 mg total) rectally at bedtime. (Patient not taking: Reported on 09/29/2020)   [DISCONTINUED] Pseudoephedrine HCl (SUDAFED SINUS CONGESTION 24HR) 240 MG TB24 Take 240 mg by mouth daily as needed (allergies). (Patient not taking: Reported on 09/29/2020)   No facility-administered encounter medications on file as of 01/09/2022.     Objective: Blood pressure 117/83, pulse 93, temperature 98.6 F (37 C), temperature source Oral, height 6' 2"  (1.88 m), weight 235 lb (106.6 kg). Patient appears to be in some discomfort and prefers to keep standing. Conjunctiva is pink. Sclera is nonicteric Oropharyngeal mucosa is normal. No neck masses or thyromegaly noted. Cardiac exam with regular rhythm normal S1 and S2. No murmur or gallop noted. Lungs are clear to auscultation. Abdomen is symmetrical soft and nontender with organomegaly or masses. Rectal examination was limited with external inspection.  Right buttock is markedly swollen with erythematous skin and from on palpation.  Spaete tender.  He has mucopurulent material in the  perianal region with indurated area appears to be released size of my hand.  Digital examination was not performed. No LE edema or clubbing noted.  Labs/studies Results:   CBC Latest Ref Rng & Units 09/29/2020 02/22/2020 02/25/2019  WBC 3.8 - 10.8 Thousand/uL 17.3(H) 12.7(H) 13.6(H)  Hemoglobin 13.2 - 17.1 g/dL 16.6 15.3 15.2  Hematocrit 38.5 - 50.0 % 47.9 45.5 43.4  Platelets 140 - 400 Thousand/uL 357 329 346    CMP  Latest Ref Rng & Units 09/29/2020 04/21/2016 08/31/2015  Glucose 65 - 99 mg/dL 81 107(H) 94  BUN 7 - 25 mg/dL 9 7 19   Creatinine 0.60 - 1.35 mg/dL 0.92 0.90 1.00  Sodium 135 - 146 mmol/L 137 133(L) 137  Potassium 3.5 - 5.3 mmol/L 4.4 4.0 4.4  Chloride 98 - 110 mmol/L 100 100(L) 100(L)  CO2 20 - 32 mmol/L 24 23 -  Calcium 8.6 - 10.3 mg/dL 9.7 9.2 -  Total Protein 6.1 - 8.1 g/dL 7.7 - -  Total Bilirubin 0.2 - 1.2 mg/dL 0.6 - -  AST 10 - 40 U/L 18 - -  ALT 9 - 46 U/L 17 - -    Hepatic Function Latest Ref Rng & Units 09/29/2020  Total Protein 6.1 - 8.1 g/dL 7.7  AST 10 - 40 U/L 18  ALT 9 - 46 U/L 17  Total Bilirubin 0.2 - 1.2 mg/dL 0.6    Lab Results  Component Value Date   CRP 8.7 (H) 09/29/2020      Assessment:  #1.  Patient is 52 year old Caucasian male with history of small and large bowel Crohn's disease with indolent course maintained on oral mesalamine who presents with 2-week history of perianal drainage of blood and pus.  Examination reveals enlargement of right buttock with erythematous skin.  Perianal skin on the right side is very tender and indurated.  His presentation is concerning for large perianal/gluteal abscess.  He possibly has developed perianal disease. He will need to be hospitalized for therapy as follows.   Plan:  Direct admission to the hospital.  Discussed with Dr. Roxan Hockey who is arranging. In addition to routine blood work patient will also need MR pelvis IV antibiotics and surgical consultation today.

## 2022-01-10 ENCOUNTER — Encounter (HOSPITAL_COMMUNITY): Payer: Self-pay | Admitting: Internal Medicine

## 2022-01-10 ENCOUNTER — Encounter (HOSPITAL_COMMUNITY)
Admission: AD | Disposition: A | Discharge status: Home / Self Care | Payer: Self-pay | Source: Ambulatory Visit | Attending: Internal Medicine

## 2022-01-10 ENCOUNTER — Inpatient Hospital Stay (HOSPITAL_COMMUNITY): Payer: 59 | Admitting: Anesthesiology

## 2022-01-10 DIAGNOSIS — K610 Anal abscess: Secondary | ICD-10-CM

## 2022-01-10 DIAGNOSIS — K603 Anal fistula: Secondary | ICD-10-CM

## 2022-01-10 DIAGNOSIS — Z8719 Personal history of other diseases of the digestive system: Secondary | ICD-10-CM

## 2022-01-10 DIAGNOSIS — Z720 Tobacco use: Secondary | ICD-10-CM

## 2022-01-10 HISTORY — PX: INCISION AND DRAINAGE PERIRECTAL ABSCESS: SHX1804

## 2022-01-10 LAB — CBC
HCT: 39.6 % (ref 39.0–52.0)
Hemoglobin: 13.3 g/dL (ref 13.0–17.0)
MCH: 31.7 pg (ref 26.0–34.0)
MCHC: 33.6 g/dL (ref 30.0–36.0)
MCV: 94.3 fL (ref 80.0–100.0)
Platelets: 383 10*3/uL (ref 150–400)
RBC: 4.2 MIL/uL — ABNORMAL LOW (ref 4.22–5.81)
RDW: 12.7 % (ref 11.5–15.5)
WBC: 13.2 10*3/uL — ABNORMAL HIGH (ref 4.0–10.5)
nRBC: 0 % (ref 0.0–0.2)

## 2022-01-10 LAB — SURGICAL PCR SCREEN
MRSA, PCR: NEGATIVE
Staphylococcus aureus: NEGATIVE

## 2022-01-10 LAB — BASIC METABOLIC PANEL
Anion gap: 9 (ref 5–15)
BUN: 12 mg/dL (ref 6–20)
CO2: 23 mmol/L (ref 22–32)
Calcium: 8.7 mg/dL — ABNORMAL LOW (ref 8.9–10.3)
Chloride: 105 mmol/L (ref 98–111)
Creatinine, Ser: 0.77 mg/dL (ref 0.61–1.24)
GFR, Estimated: >60 mL/min (ref >60)
Glucose, Bld: 110 mg/dL — ABNORMAL HIGH (ref 70–99)
Potassium: 4.1 mmol/L (ref 3.5–5.1)
Sodium: 137 mmol/L (ref 135–145)

## 2022-01-10 SURGERY — INCISION AND DRAINAGE, ABSCESS, PERIRECTAL
Anesthesia: General | Site: Rectum

## 2022-01-10 MED ORDER — PANTOPRAZOLE SODIUM 40 MG PO TBEC
40.0000 mg | DELAYED_RELEASE_TABLET | Freq: Every day | ORAL | Status: DC
  Administered 2022-01-10 – 2022-01-11 (×2): 40 mg via ORAL
  Filled 2022-01-10 (×2): qty 1

## 2022-01-10 MED ORDER — 0.9 % SODIUM CHLORIDE (POUR BTL) OPTIME
TOPICAL | Status: DC | PRN
  Administered 2022-01-10: 1000 mL

## 2022-01-10 MED ORDER — POLYETHYLENE GLYCOL 3350 17 G PO PACK
17.0000 g | PACK | Freq: Every day | ORAL | Status: DC
  Administered 2022-01-10 – 2022-01-11 (×3): 17 g via ORAL
  Filled 2022-01-10 (×2): qty 1

## 2022-01-10 MED ORDER — FENTANYL CITRATE (PF) 100 MCG/2ML IJ SOLN
INTRAMUSCULAR | Status: DC | PRN
  Administered 2022-01-10: 50 ug via INTRAVENOUS
  Administered 2022-01-10: 100 ug via INTRAVENOUS
  Administered 2022-01-10: 50 ug via INTRAVENOUS

## 2022-01-10 MED ORDER — LIDOCAINE HCL (PF) 2 % IJ SOLN
INTRAMUSCULAR | Status: AC
  Filled 2022-01-10: qty 5

## 2022-01-10 MED ORDER — ONDANSETRON HCL 4 MG/2ML IJ SOLN: 4.0000 mg | Freq: Once | INTRAMUSCULAR | Status: DC | PRN

## 2022-01-10 MED ORDER — HYDROCODONE-ACETAMINOPHEN 7.5-325 MG PO TABS: 1.0000 | ORAL_TABLET | Freq: Once | ORAL | Status: DC | PRN

## 2022-01-10 MED ORDER — PROPOFOL 10 MG/ML IV BOLUS
INTRAVENOUS | Status: AC
  Filled 2022-01-10: qty 20

## 2022-01-10 MED ORDER — LIDOCAINE VISCOUS HCL 2 % MT SOLN
OROMUCOSAL | Status: AC
  Filled 2022-01-10: qty 15

## 2022-01-10 MED ORDER — MORPHINE SULFATE (PF) 2 MG/ML IV SOLN
2.0000 mg | INTRAVENOUS | Status: DC | PRN
  Filled 2022-01-10: qty 1

## 2022-01-10 MED ORDER — LACTATED RINGERS IV SOLN
INTRAVENOUS | Status: DC | PRN
Start: 2022-01-10 — End: 2022-01-10

## 2022-01-10 MED ORDER — MIDAZOLAM HCL 2 MG/2ML IJ SOLN
INTRAMUSCULAR | Status: AC
  Filled 2022-01-10: qty 2

## 2022-01-10 MED ORDER — ROCURONIUM 10MG/ML (10ML) SYRINGE FOR MEDFUSION PUMP - OPTIME
INTRAVENOUS | Status: DC | PRN
  Administered 2022-01-10: 20 mg via INTRAVENOUS
  Administered 2022-01-10: 10 mg via INTRAVENOUS

## 2022-01-10 MED ORDER — LIDOCAINE HCL (CARDIAC) PF 50 MG/5ML IV SOSY
PREFILLED_SYRINGE | INTRAVENOUS | Status: DC | PRN
  Administered 2022-01-10: 100 mg via INTRAVENOUS

## 2022-01-10 MED ORDER — BUPIVACAINE HCL (PF) 0.5 % IJ SOLN
INTRAMUSCULAR | Status: AC
  Filled 2022-01-10: qty 30

## 2022-01-10 MED ORDER — BUPIVACAINE LIPOSOME 1.3 % IJ SUSP
INTRAMUSCULAR | Status: DC | PRN
Start: 2022-01-10 — End: 2022-01-10
  Administered 2022-01-10: 20 mL

## 2022-01-10 MED ORDER — FENTANYL CITRATE PF 50 MCG/ML IJ SOSY
PREFILLED_SYRINGE | INTRAMUSCULAR | Status: AC
  Filled 2022-01-10: qty 1

## 2022-01-10 MED ORDER — SUGAMMADEX SODIUM 200 MG/2ML IV SOLN
INTRAVENOUS | Status: DC | PRN
  Administered 2022-01-10: 200 mg via INTRAVENOUS

## 2022-01-10 MED ORDER — METHYLENE BLUE 0.5 % INJ SOLN
INTRAVENOUS | Status: AC
  Filled 2022-01-10: qty 10

## 2022-01-10 MED ORDER — MIDAZOLAM HCL 5 MG/5ML IJ SOLN
INTRAMUSCULAR | Status: DC | PRN
  Administered 2022-01-10: 2 mg via INTRAVENOUS

## 2022-01-10 MED ORDER — HYDROMORPHONE HCL 1 MG/ML IJ SOLN
0.2500 mg | INTRAMUSCULAR | Status: DC | PRN
  Administered 2022-01-10: 0.5 mg via INTRAVENOUS

## 2022-01-10 MED ORDER — METHYLENE BLUE 0.5 % INJ SOLN
INTRAVENOUS | Status: DC | PRN
  Administered 2022-01-10: 5 mL via SUBMUCOSAL

## 2022-01-10 MED ORDER — FENTANYL CITRATE (PF) 250 MCG/5ML IJ SOLN
INTRAMUSCULAR | Status: AC
  Filled 2022-01-10: qty 5

## 2022-01-10 MED ORDER — SUCCINYLCHOLINE 20MG/ML (10ML) SYRINGE FOR MEDFUSION PUMP - OPTIME
INTRAMUSCULAR | Status: DC | PRN
  Administered 2022-01-10: 120 mg via INTRAVENOUS

## 2022-01-10 MED ORDER — BUPIVACAINE LIPOSOME 1.3 % IJ SUSP
INTRAMUSCULAR | Status: AC
  Filled 2022-01-10: qty 20

## 2022-01-10 MED ORDER — PROPOFOL 10 MG/ML IV BOLUS
INTRAVENOUS | Status: DC | PRN
Start: 2022-01-10 — End: 2022-01-10
  Administered 2022-01-10: 200 mg via INTRAVENOUS

## 2022-01-10 MED ORDER — HYDROMORPHONE HCL 1 MG/ML IJ SOLN
INTRAMUSCULAR | Status: AC
  Filled 2022-01-10: qty 0.5

## 2022-01-10 MED ORDER — ONDANSETRON HCL 4 MG/2ML IJ SOLN
INTRAMUSCULAR | Status: AC
  Filled 2022-01-10: qty 2

## 2022-01-10 MED ORDER — HYDROGEN PEROXIDE 3 % EX SOLN
CUTANEOUS | Status: DC | PRN
  Administered 2022-01-10: 1

## 2022-01-10 MED ORDER — ONDANSETRON HCL 4 MG/2ML IJ SOLN
INTRAMUSCULAR | Status: DC | PRN
Start: 2022-01-10 — End: 2022-01-10
  Administered 2022-01-10: 4 mg via INTRAVENOUS

## 2022-01-10 MED ORDER — OXYCODONE HCL 5 MG PO TABS
5.0000 mg | ORAL_TABLET | ORAL | Status: DC | PRN
  Administered 2022-01-10 (×2): 5 mg via ORAL
  Filled 2022-01-10 (×2): qty 1

## 2022-01-10 MED ORDER — FENTANYL CITRATE PF 50 MCG/ML IJ SOSY
25.0000 ug | PREFILLED_SYRINGE | INTRAMUSCULAR | Status: DC | PRN
  Administered 2022-01-10 (×3): 50 ug via INTRAVENOUS
  Filled 2022-01-10: qty 1

## 2022-01-10 SURGICAL SUPPLY — 22 items
BAG HAMPER (MISCELLANEOUS) ×2 IMPLANT
CLOTH BEACON ORANGE TIMEOUT ST (SAFETY) ×2 IMPLANT
COVER LIGHT HANDLE STERIS (MISCELLANEOUS) ×4 IMPLANT
DRAPE HALF SHEET 40X57 (DRAPES) ×2 IMPLANT
ELECT REM PT RETURN 9FT ADLT (ELECTROSURGICAL) ×2
ELECTRODE REM PT RTRN 9FT ADLT (ELECTROSURGICAL) ×1 IMPLANT
GAUZE SPONGE 4X4 12PLY STRL (GAUZE/BANDAGES/DRESSINGS) ×4 IMPLANT
GLOVE SURG ENC MOIS LTX SZ6.5 (GLOVE) ×2 IMPLANT
GLOVE SURG UNDER POLY LF SZ7 (GLOVE) ×6 IMPLANT
GOWN STRL REUS W/TWL LRG LVL3 (GOWN DISPOSABLE) ×4 IMPLANT
KIT TURNOVER KIT A (KITS) ×2 IMPLANT
LOOP VESSEL MAXI BLUE (MISCELLANEOUS) ×1 IMPLANT
MANIFOLD NEPTUNE II (INSTRUMENTS) ×2 IMPLANT
NDL HYPO 21X1.5 SAFETY (NEEDLE) IMPLANT
NEEDLE HYPO 21X1.5 SAFETY (NEEDLE) ×2 IMPLANT
NS IRRIG 1000ML POUR BTL (IV SOLUTION) ×2 IMPLANT
PACK MINOR (CUSTOM PROCEDURE TRAY) ×2 IMPLANT
PAD ARMBOARD 7.5X6 YLW CONV (MISCELLANEOUS) ×2 IMPLANT
SET BASIN LINEN APH (SET/KITS/TRAYS/PACK) ×2 IMPLANT
SHEET LAVH (DRAPES) ×1 IMPLANT
SURGILUBE 2OZ TUBE FLIPTOP (MISCELLANEOUS) ×1 IMPLANT
SYR 10ML LL (SYRINGE) ×1 IMPLANT

## 2022-01-10 NOTE — Op Note (Signed)
[facility] Operative Note  01/10/22  Preoperative Diagnosis: Perianal abscess, fistula, Crohn's    Postoperative Diagnosis: Perianal abscess, complex fistula, h/o Crohn's    Procedure(s) Performed: Incision and drainage of perianal abscess, seton placement into fistulas X 2   Surgeon: Lanell Matar. Constance Haw, MD   Assistants: No qualified resident was available    Anesthesia: General endotracheal   Anesthesiologist: Trixie Rude, MD    Specimens: None    Estimated Blood Loss: Minimal   Blood Replacement: None    Complications: None   Wound Class: Dirty/ infected    Operative Indications: Aaron Phillips is a 51 yo with longstanding Crohn's who was found to have a abscess and fistula on MRI after seeing GI, Dr. Laural Golden yesterday. We discussed incision and drainage, possible seton placement, possible drain placement, risk of bleeding, infection, injury to sphincter and need to keep setons in until seen by a Colorectal/ Crohn's specialist.   Findings: Indurated and swollen right buttock extending superiorly toward perineum, posterior flutulant area with spot of drainage, induration surrounding, fistula noted at about 7 o'clock as noted on the MRI and also at 6 o c'lock midline with scarring and folds, other possible pits around the anal region posteriorly but did not go anywhere when gently probed, right buttock with hair and some ingrown hairs with some sebum expressed    Procedure: The patient was taken to the operating room and placed supine. General endotracheal anesthesia was induced. Intravenous antibiotics were administered per protocol.  He was then placed in lithotomy with all pressure points padded.  The perianal and perineum were prepared and draped in the usual sterile fashion.   Digital exam revealed no masses. There was scarring palpated posteriorly.   Right right buttock was indurated and swollen extending superiorly toward perineum, posterior flutulant area  with spot of drainage about 2cm from the anal verge and in the 6-7 o'clock position, induration surrounding. There was a spot of purulent drainage and this was injected with peroxide and methylene blue demonstrating the fistula into the anus at about 7 o'clock as described on the MRI.  An additional area of scarring and almost a fold was noted between   7 and 6 o'clock with  a second fistula noted at  the 6 o'clock position. Setons were placed in each of these areas. I used vessels loops and placed them through the fistula and secured each in two places with a 3-0 silk tie. They moved freely in the tracks. Due to the swelling it is difficult to note how much muscle is involved with the either.  The abscess cavity was opened with a scalpel and connected to the 7 o'clock seton as expected. The area was flushed with peroxide. The cavity was open and draining and I did  not pack it given the small size < 3cm and not bleeding.    Other possible pits around the anal region posteriorly but did not go anywhere when gently probed. The right buttock had extensive hair and some ingrown hairs with some sebum expressed but this did not appear to be the source of the infection.  Exparel was injected. Mesh panties and ABD were applied.  Final inspection revealed acceptable hemostasis. All counts were correct at the end of the case. The patient was awakened from anesthesia and extubated without complication.  The patient went to the PACU in stable condition.   Curlene Labrum, MD [facility] [address] [place], [place] [address] [phone] (office)

## 2022-01-10 NOTE — Anesthesia Procedure Notes (Signed)
Procedure Name: Intubation Date/Time: 01/10/2022 9:01 AM Performed by: Ollen Bowl, CRNA Pre-anesthesia Checklist: Patient identified, Patient being monitored, Timeout performed, Emergency Drugs available and Suction available Patient Re-evaluated:Patient Re-evaluated prior to induction Oxygen Delivery Method: Circle system utilized Preoxygenation: Pre-oxygenation with 100% oxygen Induction Type: IV induction Ventilation: Mask ventilation without difficulty Laryngoscope Size: Mac and 4 Grade View: Grade I Tube type: Oral Tube size: 8.0 mm Number of attempts: 1 Airway Equipment and Method: Stylet Placement Confirmation: ETT inserted through vocal cords under direct vision, positive ETCO2 and breath sounds checked- equal and bilateral Secured at: 23 cm Tube secured with: Tape Dental Injury: Teeth and Oropharynx as per pre-operative assessment

## 2022-01-10 NOTE — Progress Notes (Signed)
[facility]  Updated family. Fistula in ano and abscess, seton X2 placed and I&D done.   Sitz baths Diet as tolerated PRN for pain Will see tomorrow Discussed with Dr. Laural Golden and hospitalist.  Curlene Labrum, MD [facility] [address] [place], [place] [address] [phone] (office)

## 2022-01-10 NOTE — Progress Notes (Signed)
PROGRESS NOTE    Aaron Phillips  QQP:[id number] DOB: 06-04-1971 DOA: 01/09/2022 PCP: Redmond School, MD    Chief complaint: Perianal abscess/fistula due to Crohn's disease.   Brief Narrative:  As per H&P written by Dr. Denton Brick on 01/09/2021 Aaron Phillips is a 51 y.o. male with medical history significant for Crohn's disease, depression. Patient presented to the ED with complaints of rectal pain, with drainage of blood and pus from his rectum.  He went to see his gastroenterologist today, it was noted that he had enlargement of his right buttock, the perianal skin was tender and indurated.  He was subsequently directly admitted from the clinic to the hospital.   He otherwise denies any fevers or chills, no abdominal pain. No vomiting no diarrhea.  No cough no difficulty breathing.   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 -Definitive treatment of colonic/anal fistula has been the failure to a specialist colonic surgeon. -Patient is afebrile. -Continue recommendation for sitz and advance diet as tolerated.  2-tobacco abuse -Cessation counseling provided -Continue nicotine patch.  3-History of Crohn's disease -continue mesalamine -follow rec's by GI service.  4-hx of depression -Stable mood -Not taking antidepressant medications prior to admission.  5-gastroesophageal flux disease -Continue Protonix.   DVT prophylaxis: SCDs Code Status: Full code. Family Communication: Wife at bedside. Disposition:   Status is: Inpatient    Consultants:  General surgery Gastroenterology service  Procedures:  See below for x-ray reports. Status post I&D of perianal abscess and fistula in the setting of Crohn's disease.  Antimicrobials:   Zosyn   Subjective: Afebrile, no chest pain, no nausea, no vomiting, no shortness of breath.  Objective: Vitals:   01/10/22 1030 01/10/22 1045 01/10/22 1059 01/10/22 1259  BP: 140/81 129/83 127/85 118/74  Pulse: 83 72 78 81  Resp: (!) 22 17 17 17   Temp:   98.3 F (36.8 C) 98.9 F (37.2 C)  TempSrc:   Oral Oral  SpO2: 95% 100% 98% 98%  Weight:      Height:        Intake/Output Summary (Last 24 hours) at 01/10/2022 1730 Last data filed at 01/10/2022 1300 Gross per 24 hour  Intake 1430 ml  Output 20 ml  Net 1410 ml   Filed Weights   01/09/22 1700  Weight: 106.3 kg    Examination:  General exam: Appears calm and in no major distress currently; reports pain medications are controlling discomfort. Respiratory system: Clear to auscultation. Respiratory effort normal.  Good saturation on room air. Cardiovascular system: S1 & S2 heard, RRR. No JVD, murmurs, rubs, gallops or clicks. No pedal edema. Gastrointestinal system: Abdomen is nondistended, soft and nontender. No organomegaly or masses felt. Normal bowel sounds heard. Central nervous system: Alert and oriented. No focal neurological deficits. Extremities: No cyanosis or clubbing. Skin: No petechiae; right buttocks open wound appreciated with dressing of serosanguineous drainage.  No further purulence.  Less induration noticed.  Patient is status post I&D by general surgery. Psychiatry: Judgement and insight appear normal. Mood & affect appropriate.     Data Reviewed: I have personally reviewed following labs and imaging studies  CBC: Recent Labs  Lab 01/09/22 1620 01/10/22 0659  WBC 15.9* 13.2*  HGB 14.3 13.3  HCT 41.8 39.6  MCV 91.9 94.3  PLT 441* 712    Basic Metabolic  Panel: Recent Labs  Lab 01/09/22 1620 01/10/22 0659  NA 134* 137  K 3.7 4.1  CL 102 105  CO2 22 23  GLUCOSE 102* 110*  BUN 15 12  CREATININE 0.83 0.77  CALCIUM 8.9 8.7*    GFR: Estimated Creatinine Clearance: 145 mL/min (by C-G  formula based on SCr of 0.77 mg/dL).  Liver Function Tests: Recent Labs  Lab 01/09/22 1620  AST 15  ALT 14  ALKPHOS 79  BILITOT 0.8  PROT 8.0  ALBUMIN 3.7    CBG: No results for input(s): GLUCAP in the last 168 hours.   Recent Results (from the past 240 hour(s))  Resp Panel by RT-PCR (Flu A&B, Covid) Nasopharyngeal Swab     Status: None   Collection Time: 01/09/22  4:21 PM   Specimen: Nasopharyngeal Swab; Nasopharyngeal(NP) swabs in vial transport medium  Result Value Ref Range Status   SARS Coronavirus 2 by RT PCR NEGATIVE NEGATIVE Final    Comment: (NOTE) SARS-CoV-2 target nucleic acids are NOT DETECTED.  The SARS-CoV-2 RNA is generally detectable in upper respiratory specimens during the acute phase of infection. The lowest concentration of SARS-CoV-2 viral copies this assay can detect is 138 copies/mL. A negative result does not preclude SARS-Cov-2 infection and should not be used as the sole basis for treatment or other patient management decisions. A negative result may occur with  improper specimen collection/handling, submission of specimen other than nasopharyngeal swab, presence of viral mutation(s) within the areas targeted by this assay, and inadequate number of viral copies(<138 copies/mL). A negative result must be combined with clinical observations, patient history, and epidemiological information. The expected result is Negative.  Fact Sheet for Patients:  EntrepreneurPulse.com.au  Fact Sheet for Healthcare Providers:  IncredibleEmployment.be  This test is no t yet approved or cleared by the [place] FDA and  has been authorized for detection and/or diagnosis of SARS-CoV-2 by FDA under an Emergency Use Authorization (EUA). This EUA will remain  in effect (meaning this test can be used) for the duration of the COVID-19 declaration under Section 564(b)(1) of the Act, 21 U.S.C.section 360bbb-3(b)(1), unless the  authorization is terminated  or revoked sooner.       Influenza A by PCR NEGATIVE NEGATIVE Final   Influenza B by PCR NEGATIVE NEGATIVE Final    Comment: (NOTE) The Xpert Xpress SARS-CoV-2/FLU/RSV plus assay is intended as an aid in the diagnosis of influenza from Nasopharyngeal swab specimens and should not be used as a sole basis for treatment. Nasal washings and aspirates are unacceptable for Xpert Xpress SARS-CoV-2/FLU/RSV testing.  Fact Sheet for Patients: EntrepreneurPulse.com.au  Fact Sheet for Healthcare Providers: IncredibleEmployment.be  This test is not yet approved or cleared by the [place] FDA and has been authorized for detection and/or diagnosis of SARS-CoV-2 by FDA under an Emergency Use Authorization (EUA). This EUA will remain in effect (meaning this test can be used) for the duration of the COVID-19 declaration under Section 564(b)(1) of the Act, 21 U.S.C. section 360bbb-3(b)(1), unless the authorization is terminated or revoked.  Performed at [facility], [address], [place], [place] [address]   Surgical pcr screen     Status: None   Collection Time: 01/10/22  7:30 AM   Specimen: Nasal Mucosa; Nasal Swab  Result Value Ref Range Status   MRSA, PCR NEGATIVE NEGATIVE Final   Staphylococcus aureus NEGATIVE NEGATIVE Final    Comment: (NOTE) The Xpert SA Assay (FDA approved for NASAL specimens in patients 80 years of age  and older), is one component of a comprehensive surveillance program. It is not intended to diagnose infection nor to guide or monitor treatment. Performed at [facility], [address] [place], [place] [address]      Radiology Studies: MR PELVIS W WO CONTRAST  Result Date: 01/09/2022 CLINICAL DATA:  History of Crohn's disease, blood and pus per rectum, probable perianal abscess EXAM: MRI PELVIS WITHOUT AND WITH CONTRAST TECHNIQUE: Multiplanar multisequence MR imaging of the pelvis was  performed both before and after administration of intravenous contrast. CONTRAST:  53m GADAVIST GADOBUTROL 1 MMOL/ML IV SOLN COMPARISON:  None. FINDINGS: Urinary Tract:  No abnormality visualized. Bowel: There is mild, circumferential thickening and hyperenhancement of the included rectal mucosa. Otherwise unremarkable visualized pelvic bowel loops, mostly seen on select, limited sequences. 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 Very extensive subcutaneous edema and inflammatory fat stranding generally limits assessment of the fistula tract and skin insertion site. Vascular/Lymphatic: No pathologically enlarged lymph nodes. No significant vascular abnormality seen. Reproductive:  No mass or other significant abnormality Other:  None. Musculoskeletal: No suspicious bone lesions identified. IMPRESSION: 1. 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 2. Fistula tract is complicated by a 2.9 cm subcutaneous abscess at the right aspect of the gluteal cleft, with a probable skin insertion site somewhat further posterior to the abscess cavity. 3. Very extensive subcutaneous edema and inflammatory fat stranding generally limits assessment of the fistula tract and skin insertion site. 4. No intrapelvic abscess. 5. Mild, circumferential thickening and hyperenhancement of the included rectal mucosa, in keeping with reported history of Crohn's disease. Electronically Signed   By: ADelanna AhmadiM.D.   On: 01/09/2022 19:31    Scheduled Meds:  Mesalamine  800 mg Oral Q12H    nicotine  14 mg Transdermal Daily   Continuous Infusions:  piperacillin-tazobactam (ZOSYN)  IV 3.375 g (01/10/22 1245)     LOS: 1 day    CBarton Dubois MD [facility]   To contact the attending provider between 7A-7P or the covering provider during after hours 7P-7A, please log into the web site [email or web address] and access using universal [place] password for that web site. If you do not have the password, please call the hospital operator.  01/10/2022, 5:30 PM

## 2022-01-10 NOTE — Transfer of Care (Signed)
Immediate Anesthesia Transfer of Care Note  Patient: Aaron Phillips  Procedure(s) Performed: INCISION  AND DRAINAGE PERIRECTAL ABSCESS; seton placement x2 (Rectum)  Patient Location: PACU  Anesthesia Type:General  Level of Consciousness: awake  Airway & Oxygen Therapy: Patient Spontanous Breathing  Post-op Assessment: Report given to RN  Post vital signs: Reviewed and stable  Last Vitals:  Vitals Value Taken Time  BP    Temp    Pulse 76 01/10/22 1010  Resp    SpO2 97 % 01/10/22 1010  Vitals shown include unvalidated device data.  Last Pain:  Vitals:   01/10/22 0748  TempSrc: Oral  PainSc: 0-No pain         Complications: No notable events documented.

## 2022-01-10 NOTE — TOC Progression Note (Signed)
Transition of Care [facility]) - Progression Note    Patient Details  Name: Aaron Phillips MRN: [id number] Date of Birth: 1971-11-21  Transition of [facility] CM/SW Contact  Karn Cassis, [place] Phone Number: 01/10/2022, 11:09 AM  Clinical Narrative:    Transition of Care (TOC) Screening Note   Patient Details  Name: Aaron Phillips Date of Birth: 10/20/1971   Transition of Care [facility]) CM/SW Contact:    Karn Cassis, LCSW Phone Number: 01/10/2022, 11:09 AM    Transition of Care Department [facility]) has reviewed patient and no TOC needs have been identified at this time. We will continue to monitor patient advancement through interdisciplinary progression rounds. If new patient transition needs arise, please place a TOC consult.        Barriers to Discharge: Continued Medical Work up  Expected Discharge Plan and Services                                                 Social Determinants of Health (SDOH) Interventions    Readmission Risk Interventions No flowsheet data found.

## 2022-01-10 NOTE — Progress Notes (Signed)
Op findings reviewed with Dr. Blake Divine earlier today. Patient underwent I&D of perianal abscess with placement of 2 setons. MR pelvis reviewed with Dr. Thornton Papas earlier today. Patient patient has developed perianal fistulizing Crohn's disease with an abscess. Patient feels much better.  States his appetite is good.  He is not having abdominal pain. Abdominal examination is within normal limits.  Patient will continue on oral mesalamine for now. Once gluteal cellulitis resolves he will undergo colonoscopy to restage his disease along with MR pelvis prior to considering biologic therapy. We will plan to see patient in the office in 4 weeks.

## 2022-01-10 NOTE — Anesthesia Postprocedure Evaluation (Signed)
Anesthesia Post Note  Patient: Aaron Phillips  Procedure(s) Performed: INCISION  AND DRAINAGE PERIRECTAL ABSCESS; seton placement x2 (Rectum)  Patient location during evaluation: PACU Anesthesia Type: General Level of consciousness: awake and alert Pain management: pain level controlled Vital Signs Assessment: post-procedure vital signs reviewed and stable Respiratory status: spontaneous breathing, nonlabored ventilation, respiratory function stable and patient connected to nasal cannula oxygen Cardiovascular status: blood pressure returned to baseline and stable Postop Assessment: no apparent nausea or vomiting Anesthetic complications: no   No notable events documented.   Last Vitals:  Vitals:   01/10/22 1045 01/10/22 1059  BP: 129/83 127/85  Pulse: 72 78  Resp: 17 17  Temp:  36.8 C  SpO2: 100% 98%    Last Pain:  Vitals:   01/10/22 1059  TempSrc: Oral  PainSc:                  Glynis Smiles

## 2022-01-10 NOTE — Consult Note (Signed)
[facility] Consult  Reason for Consult: Fistula in ano, abscess, Crohn's Referring Physician: Dr. Laural Golden    HPI: Aaron Phillips is a 51 y.o. male with longstanding Crohn's since he was young who noticed swelling and went to see Rehman yesterday thinking he had a hemorrhoid. He says that the area has been swollen and painful for 2 weeks. He has had other abscesses in other locations on his body but never perianal abscesses or perianal disease from his Crohn's. 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 He has had no fever and did notice some blood and purulence. He ha continued to have regular stools.   He says his abscess has ruptured overnight.   Past Medical History:  Diagnosis Date   Anxiety    Crohn's disease ([name])    As a child he was dx.   Crohn's disease [facility])     Past Surgical History:  Procedure Laterality Date   BIOPSY  04/28/2020   Procedure: BIOPSY;  Surgeon: Rogene Houston, MD;  [facility]: [facility];  Service: Endoscopy;;   COLONOSCOPY N/A 12/09/2015   Procedure: COLONOSCOPY;  Surgeon: Rogene Houston, MD;  [facility];  Service: Endoscopy;  Laterality: N/A;  230   COLONOSCOPY N/A 04/28/2020   Procedure: COLONOSCOPY;  Surgeon: Rogene Houston, MD;  [facility];  Service: Endoscopy;  Laterality: N/A;  200   INGUINAL HERNIA REPAIR  2017    Family History  Problem Relation Age of Onset   Hypertension Mother    Hypertension Father    Yves Dill Parkinson White syndrome Sister     Social History   Tobacco Use   Smoking status: Every Day    Types: Cigarettes   Smokeless tobacco: Current    Types: Chew   Tobacco comments:    Patient states that he uses dip occasional  Substance Use Topics   Alcohol use: Yes    Comment: occas   Drug use: No    Medications: I have reviewed the patient's current medications. Prior to Admission:  Medications Prior to Admission  Medication Sig Dispense Refill Last Dose   dicyclomine (BENTYL)  10 MG capsule TAKE 1 CAPSULE(10 MG) BY MOUTH THREE TIMES DAILY BEFORE MEALS (Patient taking differently: Take 10 mg by mouth 3 (three) times daily before meals.) 90 capsule 5 Past Month   ibuprofen (ADVIL) 200 MG tablet Take 800 mg by mouth daily as needed for headache or moderate pain.   01/08/2022   Melatonin 3 MG CAPS Take 3 mg by mouth at bedtime as needed (sleep).   Past Week   Mesalamine 800 MG TBEC Take 1 tablet (800 mg total) by mouth in the morning and at bedtime. 60 tablet 11 01/08/2022   pantoprazole (PROTONIX) 40 MG tablet TAKE 1 TABLET(40 MG) BY MOUTH DAILY 90 tablet 3 Past Month   valACYclovir (VALTREX) 500 MG tablet Take 500 mg by mouth daily as needed (fever blisters).   unknown   Scheduled:  [MAR Hold] Mesalamine  800 mg Oral Q12H   [MAR Hold] nicotine  14 mg Transdermal Daily   Continuous:  [MAR Hold] piperacillin-tazobactam (ZOSYN)  IV 3.375 g (01/10/22 0456)   PRN:[MAR Hold] acetaminophen **OR** [MAR Hold] acetaminophen, [MAR Hold] traMADol  No Known Allergies   ROS:  A comprehensive review of systems was negative except for: Gastrointestinal: positive for perianal pain and swelling, drainage   Blood pressure 107/71, pulse 65, temperature 97.9 F (36.6 C), temperature source Oral, resp. rate 16,  height 6' 2"  (1.88 m), weight 106.3 kg, SpO2 96 %. Physical Exam Vitals reviewed.  Constitutional:      Appearance: Normal appearance.  HENT:     Head: Normocephalic.     Nose: Nose normal.     Mouth/Throat:     Mouth: Mucous membranes are moist.  Eyes:     Extraocular Movements: Extraocular movements intact.  Cardiovascular:     Rate and Rhythm: Normal rate.  Pulmonary:     Effort: Pulmonary effort is normal.  Abdominal:     General: There is no distension.     Palpations: Abdomen is soft.     Tenderness: There is no abdominal tenderness.  Genitourinary:    Comments: Deferred given pain, reviewed MRI Musculoskeletal:        General: No swelling.     Cervical  back: Normal range of motion.  Skin:    General: Skin is warm.  Neurological:     General: No focal deficit present.     Mental Status: He is alert and oriented to person, place, and time.  Psychiatric:        Mood and Affect: Mood normal.        Behavior: Behavior normal.    Results: Results for orders placed or performed during the hospital encounter of 01/09/22 (from the past 48 hour(s))  HIV Antibody (routine testing w rflx)     Status: None   Collection Time: 01/09/22  4:20 PM  Result Value Ref Range   HIV Screen 4th Generation wRfx Non Reactive Non Reactive    Comment: Performed at [facility], [address] [place], [place] [address]  CBC     Status: Abnormal   Collection Time: 01/09/22  4:20 PM  Result Value Ref Range   WBC 15.9 (H) 4.0 - 10.5 K/uL   RBC 4.55 4.22 - 5.81 MIL/uL   Hemoglobin 14.3 13.0 - 17.0 g/dL   HCT 41.8 39.0 - 52.0 %   MCV 91.9 80.0 - 100.0 fL   MCH 31.4 26.0 - 34.0 pg   MCHC 34.2 30.0 - 36.0 g/dL   RDW 12.6 11.5 - 15.5 %   Platelets 441 (H) 150 - 400 K/uL   nRBC 0.0 0.0 - 0.2 %    Comment: Performed at [facility], [address], [place], [place] [address]  Comprehensive metabolic panel     Status: Abnormal   Collection Time: 01/09/22  4:20 PM  Result Value Ref Range   Sodium 134 (L) 135 - 145 mmol/L   Potassium 3.7 3.5 - 5.1 mmol/L   Chloride 102 98 - 111 mmol/L   CO2 22 22 - 32 mmol/L   Glucose, Bld 102 (H) 70 - 99 mg/dL    Comment: Glucose reference range applies only to samples taken after fasting for at least 8 hours.   BUN 15 6 - 20 mg/dL   Creatinine, Ser 0.83 0.61 - 1.24 mg/dL   Calcium 8.9 8.9 - 10.3 mg/dL   Total Protein 8.0 6.5 - 8.1 g/dL   Albumin 3.7 3.5 - 5.0 g/dL   AST 15 15 - 41 U/L   ALT 14 0 - 44 U/L   Alkaline Phosphatase 79 38 - 126 U/L   Total Bilirubin 0.8 0.3 - 1.2 mg/dL   GFR, Estimated >60 >60 mL/min    Comment: (NOTE) Calculated using the CKD-EPI Creatinine Equation (2021)    Anion gap 10 5 - 15     Comment: Performed at [facility], [address] [place], [place] [address]  Sedimentation rate     Status: Abnormal   Collection Time: 01/09/22  4:20 PM  Result Value Ref Range   Sed Rate 70 (H) 0 - 16 mm/hr    Comment: Performed at [facility], [address] [place], [place] [address]  C-reactive protein     Status: Abnormal   Collection Time: 01/09/22  4:20 PM  Result Value Ref Range   CRP 6.4 (H) <1.0 mg/dL    Comment: Performed at [facility], [place] [address] [place], [place] [address]  Resp Panel by RT-PCR (Flu A&B, Covid) Nasopharyngeal Swab     Status: None   Collection Time: 01/09/22  4:21 PM   Specimen: Nasopharyngeal Swab; Nasopharyngeal(NP) swabs in vial transport medium  Result Value Ref Range   SARS Coronavirus 2 by RT PCR NEGATIVE NEGATIVE    Comment: (NOTE) SARS-CoV-2 target nucleic acids are NOT DETECTED.  The SARS-CoV-2 RNA is generally detectable in upper respiratory specimens during the acute phase of infection. The lowest concentration of SARS-CoV-2 viral copies this assay can detect is 138 copies/mL. A negative result does not preclude SARS-Cov-2 infection and should not be used as the sole basis for treatment or other patient management decisions. A negative result may occur with  improper specimen collection/handling, submission of specimen other than nasopharyngeal swab, presence of viral mutation(s) within the areas targeted by this assay, and inadequate number of viral copies(<138 copies/mL). A negative result must be combined with clinical observations, patient history, and epidemiological information. The expected result is Negative.  Fact Sheet for Patients:  EntrepreneurPulse.com.au  Fact Sheet for Healthcare Providers:  IncredibleEmployment.be  This test is no t yet approved or cleared by the [place] FDA and  has been authorized for detection and/or diagnosis of SARS-CoV-2 by FDA under an  Emergency Use Authorization (EUA). This EUA will remain  in effect (meaning this test can be used) for the duration of the COVID-19 declaration under Section 564(b)(1) of the Act, 21 U.S.C.section 360bbb-3(b)(1), unless the authorization is terminated  or revoked sooner.       Influenza A by PCR NEGATIVE NEGATIVE   Influenza B by PCR NEGATIVE NEGATIVE    Comment: (NOTE) The Xpert Xpress SARS-CoV-2/FLU/RSV plus assay is intended as an aid in the diagnosis of influenza from Nasopharyngeal swab specimens and should not be used as a sole basis for treatment. Nasal washings and aspirates are unacceptable for Xpert Xpress SARS-CoV-2/FLU/RSV testing.  Fact Sheet for Patients: EntrepreneurPulse.com.au  Fact Sheet for Healthcare Providers: IncredibleEmployment.be  This test is not yet approved or cleared by the [place] FDA and has been authorized for detection and/or diagnosis of SARS-CoV-2 by FDA under an Emergency Use Authorization (EUA). This EUA will remain in effect (meaning this test can be used) for the duration of the COVID-19 declaration under Section 564(b)(1) of the Act, 21 U.S.C. section 360bbb-3(b)(1), unless the authorization is terminated or revoked.  Performed at [facility], [address], [place], [place] [address]   Basic metabolic panel     Status: Abnormal   Collection Time: 01/10/22  6:59 AM  Result Value Ref Range   Sodium 137 135 - 145 mmol/L   Potassium 4.1 3.5 - 5.1 mmol/L   Chloride 105 98 - 111 mmol/L   CO2 23 22 - 32 mmol/L   Glucose, Bld 110 (H) 70 - 99 mg/dL    Comment: Glucose reference range applies only to samples taken after fasting for  at least 8 hours.   BUN 12 6 - 20 mg/dL   Creatinine, Ser 0.77 0.61 - 1.24 mg/dL   Calcium 8.7 (L) 8.9 - 10.3 mg/dL   GFR, Estimated >60 >60 mL/min    Comment: (NOTE) Calculated using the CKD-EPI Creatinine Equation (2021)    Anion gap 9 5 - 15    Comment: Performed  at [facility], [address] [place], [place] [address]  CBC     Status: Abnormal   Collection Time: 01/10/22  6:59 AM  Result Value Ref Range   WBC 13.2 (H) 4.0 - 10.5 K/uL   RBC 4.20 (L) 4.22 - 5.81 MIL/uL   Hemoglobin 13.3 13.0 - 17.0 g/dL   HCT 39.6 39.0 - 52.0 %   MCV 94.3 80.0 - 100.0 fL   MCH 31.7 26.0 - 34.0 pg   MCHC 33.6 30.0 - 36.0 g/dL   RDW 12.7 11.5 - 15.5 %   Platelets 383 150 - 400 K/uL   nRBC 0.0 0.0 - 0.2 %    Comment: Performed at [facility], [address], [place],  [address]   Personally reviewed MRI and reviewed with patient and family- abscess 2.9 cm and fistula at 7 oclock right posterior  MR PELVIS W WO CONTRAST  Result Date: 01/09/2022 CLINICAL DATA:  History of Crohn's disease, blood and pus per rectum, probable perianal abscess EXAM: MRI PELVIS WITHOUT AND WITH CONTRAST TECHNIQUE: Multiplanar multisequence MR imaging of the pelvis was performed both before and after administration of intravenous contrast. CONTRAST:  28m GADAVIST GADOBUTROL 1 MMOL/ML IV SOLN COMPARISON:  None. FINDINGS: Urinary Tract:  No abnormality visualized. Bowel: There is mild, circumferential thickening and hyperenhancement of the included rectal mucosa. Otherwise unremarkable visualized pelvic bowel loops, mostly seen on select, limited sequences. 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 Very extensive subcutaneous edema and inflammatory fat stranding generally limits assessment of the fistula tract and skin insertion site. Vascular/Lymphatic: No pathologically enlarged lymph nodes. No significant vascular  abnormality seen. Reproductive:  No mass or other significant abnormality Other:  None. Musculoskeletal: No suspicious bone lesions identified. IMPRESSION: 1. 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 2. Fistula tract is complicated by a 2.9 cm subcutaneous abscess at the right aspect of the gluteal cleft, with a probable skin insertion site somewhat further posterior to the abscess cavity. 3. Very extensive subcutaneous edema and inflammatory fat stranding generally limits assessment of the fistula tract and skin insertion site. 4. No intrapelvic abscess. 5. Mild, circumferential thickening and hyperenhancement of the included rectal mucosa, in keeping with reported history of Crohn's disease. Electronically Signed   By: ADelanna AhmadiM.D.   On: 01/09/2022 19:31     Assessment & Plan:  CIFEANYI MICKELSONis a 51y.o. male with a perianal abscess and fistula in the setting of Crohn's. Discussed risk of bleeding, infection, issues with healing with Crohn's need for seton placement give no fistulotomy in a Crohn's patient. Discussed possible drain placement versus packing. Discussed that he will need to see a Colorectal specialist and  likely need his therapy changed for his perianal Crohn's disease. We are taking care of the acute infection but the ultimate plans for the fistula pend on the specialist.     All questions were answered to the satisfaction of the patient and family.    Virl Cagey 01/10/2022, 7:37 AM

## 2022-01-10 NOTE — Anesthesia Preprocedure Evaluation (Signed)
Anesthesia Evaluation  Patient identified by MRN, date of birth, ID band Patient awake    Reviewed: Allergy & Precautions, NPO status , Patient's Chart, lab work & pertinent test results  Airway Mallampati: II   Neck ROM: Full    Dental no notable dental hx.    Pulmonary neg pulmonary ROS, Current Smoker and Patient abstained from smoking.,    Pulmonary exam normal        Cardiovascular negative cardio ROS Normal cardiovascular exam     Neuro/Psych PSYCHIATRIC DISORDERS Anxiety Depression negative neurological ROS     GI/Hepatic Neg liver ROS, chrons   Endo/Other  negative endocrine ROS  Renal/GU negative Renal ROS     Musculoskeletal negative musculoskeletal ROS (+)   Abdominal Normal abdominal exam  (+)   Peds  Hematology negative hematology ROS (+)   Anesthesia Other Findings   Reproductive/Obstetrics                             Anesthesia Physical Anesthesia Plan  ASA: 2  Anesthesia Plan: General   Post-op Pain Management:    Induction: Intravenous  PONV Risk Score and Plan: 1 and Ondansetron and Dexamethasone  Airway Management Planned: Oral ETT  Additional Equipment:   Intra-op Plan:   Post-operative Plan: Extubation in OR  Informed Consent: I have reviewed the patients History and Physical, chart, labs and discussed the procedure including the risks, benefits and alternatives for the proposed anesthesia with the patient or authorized representative who has indicated his/her understanding and acceptance.     Dental advisory given  Plan Discussed with: CRNA  Anesthesia Plan Comments:         Anesthesia Quick Evaluation

## 2022-01-10 NOTE — Progress Notes (Signed)
Patient was taken to surgery for I/D perirectal abscess. He is ambulating in room.  Pain has been controled with Oxycodone PRN. Expecting discharge tomorrow.

## 2022-01-11 ENCOUNTER — Encounter (HOSPITAL_COMMUNITY): Payer: Self-pay | Admitting: General Surgery

## 2022-01-11 DIAGNOSIS — K219 Gastro-esophageal reflux disease without esophagitis: Secondary | ICD-10-CM

## 2022-01-11 LAB — CBC
HCT: 37.5 % — ABNORMAL LOW (ref 39.0–52.0)
Hemoglobin: 12.6 g/dL — ABNORMAL LOW (ref 13.0–17.0)
MCH: 31.4 pg (ref 26.0–34.0)
MCHC: 33.6 g/dL (ref 30.0–36.0)
MCV: 93.5 fL (ref 80.0–100.0)
Platelets: 355 10*3/uL (ref 150–400)
RBC: 4.01 MIL/uL — ABNORMAL LOW (ref 4.22–5.81)
RDW: 12.7 % (ref 11.5–15.5)
WBC: 14.4 10*3/uL — ABNORMAL HIGH (ref 4.0–10.5)
nRBC: 0 % (ref 0.0–0.2)

## 2022-01-11 LAB — BASIC METABOLIC PANEL
Anion gap: 7 (ref 5–15)
BUN: 9 mg/dL (ref 6–20)
CO2: 26 mmol/L (ref 22–32)
Calcium: 8.5 mg/dL — ABNORMAL LOW (ref 8.9–10.3)
Chloride: 103 mmol/L (ref 98–111)
Creatinine, Ser: 0.84 mg/dL (ref 0.61–1.24)
GFR, Estimated: >60 mL/min (ref >60)
Glucose, Bld: 115 mg/dL — ABNORMAL HIGH (ref 70–99)
Potassium: 4.1 mmol/L (ref 3.5–5.1)
Sodium: 136 mmol/L (ref 135–145)

## 2022-01-11 LAB — HEPATITIS B SURFACE ANTIGEN: Hepatitis B Surface Ag: NONREACTIVE

## 2022-01-11 MED ORDER — NICOTINE 14 MG/24HR TD PT24: 14.0000 mg | MEDICATED_PATCH | Freq: Every day | TRANSDERMAL | 2 refills | Status: DC

## 2022-01-11 MED ORDER — AMOXICILLIN-POT CLAVULANATE 875-125 MG PO TABS: 1.0000 | ORAL_TABLET | Freq: Two times a day (BID) | ORAL | 0 refills | Status: AC

## 2022-01-11 MED ORDER — ACETAMINOPHEN 325 MG PO TABS: 650.0000 mg | ORAL_TABLET | Freq: Four times a day (QID) | ORAL | 0 refills | Status: DC | PRN

## 2022-01-11 MED ORDER — POLYETHYLENE GLYCOL 3350 17 G PO PACK: 17.0000 g | PACK | Freq: Every day | ORAL | 0 refills | Status: DC

## 2022-01-11 MED ORDER — TRAMADOL HCL 50 MG PO TABS: 100.0000 mg | ORAL_TABLET | Freq: Three times a day (TID) | ORAL | 0 refills | Status: AC | PRN

## 2022-01-11 NOTE — Discharge Summary (Signed)
Physician Discharge Summary  Aaron Phillips PIR:[id number] DOB: 07/30/1971 DOA: 01/09/2022  PCP: Redmond School, MD  Admit date: 01/09/2022 Discharge date: 01/11/2022  Time spent: 35 minutes  Recommendations for Outpatient Follow-up:  Repeat basic metabolic panel to follow to lites and renal function. Continue assisting patient with a smoking cessation Make sure patient has follow-up with general surgery and gastroenterology as instructed. Close monitoring to patient's CBGs as he was found hyperglycemic during fasting blood work in the hospital. Repeat CBC to follow WBCs level/trend and assess hemoglobin stability.   Discharge Diagnoses:  Principal Problem:   Perianal abscess Active Problems:   History of Crohn's disease   Anal fistula   Gastroesophageal reflux disease Hyperglycemia  Discharge Condition: Stable and improved.  Discharged home with instruction to follow-up with gastroenterology and general surgery.  CODE STATUS: Full code  Diet recommendation: Regular diet.  Filed Weights   01/09/22 1700  Weight: 106.3 kg    History of present illness:  As per H&P written by Dr. Denton Brick on 01/09/2021 Aaron Phillips is a 51 y.o. male with medical history significant for Crohn's disease, depression. Patient presented to the ED with complaints of rectal pain, with drainage of blood and pus from his rectum.  He went to see his gastroenterologist today, it was noted that he had enlargement of his right buttock, the perianal skin was tender and indurated.  He was subsequently directly admitted from the clinic to the hospital.   He otherwise denies any fevers or chills, no abdominal pain. No vomiting no diarrhea.  No cough no difficulty breathing.  Hospital Course:  1-perianal abscess with colonic fistula in the setting of Crohn's disease -Status post I&D by general surgery -Continue as needed analgesics and outpatient follow-up with general surgery. -Continue Augmentin  orally for 7 more days at discharge. -Definitive treatment of colonic/anal fistula has been the failure to a specialist colonic surgeon. -Patient is afebrile and with his WBCs trending down appropriately.. -Continue recommendation for sitz bath and wound care as recommended/dictated by general surgery.   2-tobacco abuse -Cessation counseling provided -Continue nicotine patch at discharge.   3-History of Crohn's disease -continue mesalamine -Following GI recommendations we will be checking for QuantiFERON gold and hepatitis B surface antigen in anticipation for initiation of biological therapy. -Continue patient follow-up with GI.   4-hx of depression -Stable mood -Not taking antidepressant medications prior to admission.   5-gastroesophageal flux disease -Continue Protonix.   6-hyperglycemia -No prior history of diabetes -Lifestyle modification discussed with patient -Continue adequate hydration -Continue outpatient follow-up of patient's CBGs/A1c.  Procedures: See below for x-ray reports. Status post I&D of perianal abscess and fistula in the setting of Crohn's disease.   Consultations: Gastroenterology service General surgery  Discharge Exam: Vitals:   01/10/22 2020 01/11/22 0454  BP: 117/73 106/78  Pulse: 71 73  Resp: 19 20  Temp: 98.9 F (37.2 C) 97.7 F (36.5 C)  SpO2: 97% 96%   General exam: Appears calm and in no major distress currently; reports pain medications are controlling discomfort. Respiratory system: Clear to auscultation. Respiratory effort normal.  Good saturation on room air. Cardiovascular system: S1 & S2 heard, RRR. No JVD, murmurs, rubs, gallops or clicks. No pedal edema. Gastrointestinal system: Abdomen is nondistended, soft and nontender. No organomegaly or masses felt. Normal bowel sounds heard. Central nervous system: Alert and oriented. No focal neurological deficits. Extremities: No cyanosis or clubbing. Skin: No petechiae; right  buttocks open wound appreciated with dressing of serosanguineous drainage.  No further purulence.  Less induration noticed.  Patient is status post I&D by general surgery. Psychiatry: Judgement and insight appear normal. Mood & affect appropriate.        Discharge Instructions   Discharge Instructions     Diet - low sodium heart healthy   Complete by: As directed    Discharge instructions   Complete by: As directed    Take medications as prescribed Maintain adequate hydration Be mindful about the content of sugar in your diet Follow-up with your as instructed Follow-up with gastroenterology service in 2 weeks (office will set up appointment for you and contact you with details).   Discharge wound care:   Complete by: As directed    Dressing: You can replace your pad as needed after surgery. Feminine pads work best.  It is normal to see some bloody drainage on the dressing.  Bleeding should not be heavy or continuous.    Hygiene: Keep the surgical area clean by taking Sitz baths (or tub baths) several times per day. These are helpful for cleaning after each bowel movement. Frequent showering is an alternative to baths, although many patients find baths soothing after surgery.  Equipment for [employer] can be purchased at [employer] or medical/surgical supply stores. o Use warm (not hot) water for cleansing. o Pat the area dry or use a hairdryer to evaporate any residual moisture. o If you use a hairdryer, use the cool or warm setting, to avoid potential heat injury to the skin.  Sitting on a pillow or ice pack may also provide relief. Do not apply ice directly to the skin, use a towel or pillow case as a buffer. We do not encourage sitting on an inflatable ring ("doughnut") as this leads to unopposed downward pressure in the anal area.  You should gently rotate the [place], every other day or so, to prevent it from getting crusted with bodily fluids.  Occasionally,  the Delphina Cahill may become displaced and fall out. If this occurs, it is not an emergency. Please call our office the next business day so that we may evaluate.   Increase activity slowly   Complete by: As directed       Allergies as of 01/11/2022   No Known Allergies      Medication List     TAKE these medications    acetaminophen 325 MG tablet Commonly known as: TYLENOL Take 2 tablets (650 mg total) by mouth every 6 (six) hours as needed for mild pain, fever or headache.   amoxicillin-clavulanate 875-125 MG tablet Commonly known as: Augmentin Take 1 tablet by mouth 2 (two) times daily for 7 days.   dicyclomine 10 MG capsule Commonly known as: BENTYL TAKE 1 CAPSULE(10 MG) BY MOUTH THREE TIMES DAILY BEFORE MEALS What changed: See the new instructions.   ibuprofen 200 MG tablet Commonly known as: ADVIL Take 800 mg by mouth daily as needed for headache or moderate pain.   Melatonin 3 MG Caps Take 3 mg by mouth at bedtime as needed (sleep).   Mesalamine 800 MG Tbec Take 1 tablet (800 mg total) by mouth in the morning and at bedtime.   nicotine 14 mg/24hr patch Commonly known as: NICODERM CQ - dosed in mg/24 hours Place 1 patch (14 mg total) onto the skin daily. Start taking on: January 12, 2022   pantoprazole 40 MG tablet Commonly known as: PROTONIX TAKE 1 TABLET(40 MG) BY MOUTH DAILY   polyethylene glycol 17 g packet Commonly known as:  MIRALAX / GLYCOLAX Take 17 g by mouth daily. Start taking on: January 12, 2022   traMADol 50 MG tablet Commonly known as: ULTRAM Take 2 tablets (100 mg total) by mouth every 8 (eight) hours as needed for up to 7 days for severe pain.   valACYclovir 500 MG tablet Commonly known as: VALTREX Take 500 mg by mouth daily as needed (fever blisters).               Discharge Care Instructions  (From admission, onward)           Start     Ordered   01/11/22 0000  Discharge wound care:       Comments: Dressing: You can  replace your pad as needed after surgery. Feminine pads work best.  It is normal to see some bloody drainage on the dressing.  Bleeding should not be heavy or continuous.    Hygiene: Keep the surgical area clean by taking Sitz baths (or tub baths) several times per day. These are helpful for cleaning after each bowel movement. Frequent showering is an alternative to baths, although many patients find baths soothing after surgery.  Equipment for [employer] can be purchased at [employer] or medical/surgical supply stores. o Use warm (not hot) water for cleansing. o Pat the area dry or use a hairdryer to evaporate any residual moisture. o If you use a hairdryer, use the cool or warm setting, to avoid potential heat injury to the skin.  Sitting on a pillow or ice pack may also provide relief. Do not apply ice directly to the skin, use a towel or pillow case as a buffer. We do not encourage sitting on an inflatable ring ("doughnut") as this leads to unopposed downward pressure in the anal area.  You should gently rotate the [place], every other day or so, to prevent it from getting crusted with bodily fluids.  Occasionally, the Delphina Cahill may become displaced and fall out. If this occurs, it is not an emergency. Please call our office the next business day so that we may evaluate.   01/11/22 1244           No Known Allergies  Follow-up Information     Redmond School, MD. Schedule an appointment as soon as possible for a visit in 10 day(s).   Specialty: Internal Medicine Contact information: [address] [place] [address] [phone]                  The results of significant diagnostics from this hospitalization (including imaging, microbiology, ancillary and laboratory) are listed below for reference.    Significant Diagnostic Studies: MR PELVIS W WO CONTRAST  Result Date: 01/09/2022 CLINICAL DATA:  History of Crohn's disease, blood and pus per  rectum, probable perianal abscess EXAM: MRI PELVIS WITHOUT AND WITH CONTRAST TECHNIQUE: Multiplanar multisequence MR imaging of the pelvis was performed both before and after administration of intravenous contrast. CONTRAST:  57m GADAVIST GADOBUTROL 1 MMOL/ML IV SOLN COMPARISON:  None. FINDINGS: Urinary Tract:  No abnormality visualized. Bowel: There is mild, circumferential thickening and hyperenhancement of the included rectal mucosa. Otherwise unremarkable visualized pelvic bowel loops, mostly seen on select, limited sequences. There is a right intersphincteric anal fistula arising at 7 o'clock face in lithotomy position approximately 2 cm above the anal verge (series 22, image 16, series 24, image 15) and extending posteriorly to a subcutaneous rim enhancing abscess at the right aspect of the gluteal cleft measuring 2.9 x 2.0 cm (series  24, image 16), with a probable skin insertion site somewhat further posterior to the abscess cavity. Very extensive subcutaneous edema and inflammatory fat stranding generally limits assessment of the fistula tract and skin insertion site. Vascular/Lymphatic: No pathologically enlarged lymph nodes. No significant vascular abnormality seen. Reproductive:  No mass or other significant abnormality Other:  None. Musculoskeletal: No suspicious bone lesions identified. IMPRESSION: 1. 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 2. Fistula tract is complicated by a 2.9 cm subcutaneous abscess at the right aspect of the gluteal cleft, with a probable skin insertion site somewhat further posterior to the abscess cavity. 3. Very extensive subcutaneous edema and inflammatory fat stranding generally limits assessment of the fistula tract and skin insertion site. 4. No intrapelvic abscess. 5. Mild, circumferential thickening and hyperenhancement of the included rectal mucosa, in keeping with reported  history of Crohn's disease. Electronically Signed   By: Delanna Ahmadi M.D.   On: 01/09/2022 19:31    Microbiology: Recent Results (from the past 240 hour(s))  Resp Panel by RT-PCR (Flu A&B, Covid) Nasopharyngeal Swab     Status: None   Collection Time: 01/09/22  4:21 PM   Specimen: Nasopharyngeal Swab; Nasopharyngeal(NP) swabs in vial transport medium  Result Value Ref Range Status   SARS Coronavirus 2 by RT PCR NEGATIVE NEGATIVE Final    Comment: (NOTE) SARS-CoV-2 target nucleic acids are NOT DETECTED.  The SARS-CoV-2 RNA is generally detectable in upper respiratory specimens during the acute phase of infection. The lowest concentration of SARS-CoV-2 viral copies this assay can detect is 138 copies/mL. A negative result does not preclude SARS-Cov-2 infection and should not be used as the sole basis for treatment or other patient management decisions. A negative result may occur with  improper specimen collection/handling, submission of specimen other than nasopharyngeal swab, presence of viral mutation(s) within the areas targeted by this assay, and inadequate number of viral copies(<138 copies/mL). A negative result must be combined with clinical observations, patient history, and epidemiological information. The expected result is Negative.  Fact Sheet for Patients:  EntrepreneurPulse.com.au  Fact Sheet for Healthcare Providers:  IncredibleEmployment.be  This test is no t yet approved or cleared by the [place] FDA and  has been authorized for detection and/or diagnosis of SARS-CoV-2 by FDA under an Emergency Use Authorization (EUA). This EUA will remain  in effect (meaning this test can be used) for the duration of the COVID-19 declaration under Section 564(b)(1) of the Act, 21 U.S.C.section 360bbb-3(b)(1), unless the authorization is terminated  or revoked sooner.       Influenza A by PCR NEGATIVE NEGATIVE Final   Influenza B by  PCR NEGATIVE NEGATIVE Final    Comment: (NOTE) The Xpert Xpress SARS-CoV-2/FLU/RSV plus assay is intended as an aid in the diagnosis of influenza from Nasopharyngeal swab specimens and should not be used as a sole basis for treatment. Nasal washings and aspirates are unacceptable for Xpert Xpress SARS-CoV-2/FLU/RSV testing.  Fact Sheet for Patients: EntrepreneurPulse.com.au  Fact Sheet for Healthcare Providers: IncredibleEmployment.be  This test is not yet approved or cleared by the [place] FDA and has been authorized for detection and/or diagnosis of SARS-CoV-2 by FDA under an Emergency Use Authorization (EUA). This EUA will remain in effect (meaning this test can be used) for the duration of the COVID-19 declaration under Section 564(b)(1) of the Act, 21 U.S.C. section 360bbb-3(b)(1), unless the authorization is terminated or revoked.  Performed at  [facility], [address], [place], [place] [address]   Surgical pcr screen     Status: None   Collection Time: 01/10/22  7:30 AM   Specimen: Nasal Mucosa; Nasal Swab  Result Value Ref Range Status   MRSA, PCR NEGATIVE NEGATIVE Final   Staphylococcus aureus NEGATIVE NEGATIVE Final    Comment: (NOTE) The Xpert SA Assay (FDA approved for NASAL specimens in patients 74 years of age and older), is one component of a comprehensive surveillance program. It is not intended to diagnose infection nor to guide or monitor treatment. Performed at [facility], [address], [place], [place] [address]      Labs: Basic Metabolic Panel: Recent Labs  Lab 01/09/22 1620 01/10/22 0659 01/11/22 0606  NA 134* 137 136  K 3.7 4.1 4.1  CL 102 105 103  CO2 22 23 26   GLUCOSE 102* 110* 115*  BUN 15 12 9   CREATININE 0.83 0.77 0.84  CALCIUM 8.9 8.7* 8.5*   Liver Function Tests: Recent Labs  Lab 01/09/22 1620  AST 15  ALT 14  ALKPHOS 79  BILITOT 0.8  PROT 8.0  ALBUMIN 3.7   CBC: Recent Labs   Lab 01/09/22 1620 01/10/22 0659 01/11/22 0606  WBC 15.9* 13.2* 14.4*  HGB 14.3 13.3 12.6*  HCT 41.8 39.6 37.5*  MCV 91.9 94.3 93.5  PLT 441* 383 355    Signed:  Barton Dubois MD.  [facility] 01/11/2022, 12:46 PM

## 2022-01-11 NOTE — Progress Notes (Signed)
Patient given sitz bath.

## 2022-01-11 NOTE — Discharge Instructions (Signed)
Seton Placement/ Anal Fistula Discharge Instructions:   What is the purpose of a seton placement? Setons are used to treat anal fistulas. A fistula is an abnormal tunnel that connects two structures. An anal fistula is an abnormal tunnel that forms between the anus and the surrounding skin. The fistulas internal opening is in the anus and the external opening is typically on the skin around the anus or on the buttocks.  Activity:  Many individuals are able to return to work and resume routine activities the day after their procedure. Some people require a week off from work if the surgery is more extensive. Generally, within 1-2 weeks, surgical discomfort is Minimal.  Care Instructions: You can resume your normal diet once you have sufficiently recovered from anesthesia. You should drink lots of liquid. Water is best. Try to drink at least 6-8 glasses of liquid daily.  Medications: It is very important to prevent constipation after surgery. You should take a stool softener, such as docusate sodium (Colace), twice a day for the first two weeks. Also take a fiber supplement such as Benefiber, Citrucel, or Metamucil, twice a day every day. Consult your pharmacist if you need help. If your stools become loose, you can stop taking the softeners.  Take tylenol and ibuprofen as needed for pain control, alternating every 4-6 hours.  Example:  Tylenol 1042m @ 6am, 12noon, 6pm, 126mnight (Do not exceed 400037mf tylenol a day).  Ibuprofen 800m78m9am, 3pm, 9pm, 3am (Do not exceed 3600mg65mibuprofen a day).  Take Roxicodone for breakthrough pain every 4 hours.  Drink plenty of water to also prevent constipation.  Do not drive, operate machinery, or drink alcohol when taking narcotic pain medication.  Within a few days, your pain should be sufficiently controlled with medications such as ibuprofen or acetaminophen.  Dressing: You can replace your pad as needed after surgery. Feminine  pads work best.  It is normal to see some bloody drainage on the dressing.  Bleeding should not be heavy or continuous.   Hygiene: Keep the surgical area clean by taking Sitz baths (or tub baths) several times per day. These are helpful for cleaning after each bowel movement. Frequent showering is an alternative to baths, although many patients find baths soothing after surgery.  Equipment for Sitz [employer] purchased at many [employer]/surgical supply stores. o Use warm (not hot) water for cleansing. o Pat the area dry or use a hairdryer to evaporate any residual moisture. o If you use a hairdryer, use the cool or warm setting, to avoid potential heat injury to the skin.  Sitting on a pillow or ice pack may also provide relief. Do not apply ice directly to the skin, use a towel or pillow case as a buffer. We do not encourage sitting on an inflatable ring (doughnut) as this leads to unopposed downward pressure in the anal area.  You should gently rotate the [place] other day or so, to prevent it from getting crusted with bodily fluids.  Occasionally, the SetonDelphina Cahillbecome displaced and fall out. If this occurs, it is not an emergency. Please call our office the next business day so that we may evaluate.  What symptoms should I expect?  It is normal to have pain for up to 1-2 weeks. Thereafter, you may notice discomfort with prolonged sitting and certain activities. Pain should not be constant or worsening.   Placement of Setons may stimulate mucus production so the volume of drainage  you are having may increase at first. The volume of drainage should lessen as healing occurs.

## 2022-01-11 NOTE — Progress Notes (Signed)
[facility]  Doing better from pain standpoint. Did sitz bath.  BP 106/78 (BP Location: Right Arm)    Pulse 73    Temp 97.7 F (36.5 C)    Resp 20    Ht 6' 2"  (1.88 m)    Wt 106.3 kg    SpO2 96%    BMI 30.09 kg/m  Setons in place drainage  Will see next week in office. Seton instructions given to patient. Augmentin for 7 more days.  Updated Dr. Dyann Kief.  Future Appointments  Date Time Provider [place]  01/18/2022  1:45 PM Virl Cagey, MD [facility] None    Curlene Labrum, MD [facility] [address] [place], [place] [id number] [phone] (office)

## 2022-01-11 NOTE — Progress Notes (Signed)
Print for patient:  [facility] Placement/ Anal Fistula Discharge Instructions:   What is the purpose of a seton placement? Setons are used to treat anal fistulas. A fistula is an abnormal tunnel that connects two structures. An anal fistula is an abnormal tunnel that forms between the anus and the surrounding skin. The fistulas internal opening is in the anus and the external opening is typically on the skin around the anus or on the buttocks.  Activity:  Many individuals are able to return to work and resume routine activities the day after their procedure. Some people require a week off from work if the surgery is more extensive. Generally, within 1-2 weeks, surgical discomfort is Minimal.  Care Instructions: You can resume your normal diet once you have sufficiently recovered from anesthesia. You should drink lots of liquid. Water is best. Try to drink at least 6-8 glasses of liquid daily.  Medications: It is very important to prevent constipation after surgery. You should take a stool softener, such as docusate sodium (Colace), twice a day for the first two weeks. Also take a fiber supplement such as Benefiber, Citrucel, or Metamucil, twice a day every day. Consult your pharmacist if you need help. If your stools become loose, you can stop taking the softeners.  Take tylenol and ibuprofen as needed for pain control, alternating every 4-6 hours.  Example:  Tylenol 1076m @ 6am, 12noon, 6pm, 188mnight (Do not exceed 400029mf tylenol a day).  Ibuprofen 800m48m9am, 3pm, 9pm, 3am (Do not exceed 3600mg37mibuprofen a day).  Take Roxicodone for breakthrough pain every 4 hours.  Drink plenty of water to also prevent constipation.  Do not drive, operate machinery, or drink alcohol when taking narcotic pain medication.  Within a few days, your pain should be sufficiently controlled with medications such as ibuprofen or acetaminophen.  Dressing: You can replace your pad as needed after  surgery. Feminine pads work best.  It is normal to see some bloody drainage on the dressing.  Bleeding should not be heavy or continuous.   Hygiene: Keep the surgical area clean by taking Sitz baths (or tub baths) several times per day. These are helpful for cleaning after each bowel movement. Frequent showering is an alternative to baths, although many patients find baths soothing after surgery.  Equipment for Sitz [employer] purchased at many [employer]/surgical supply stores. o Use warm (not hot) water for cleansing. o Pat the area dry or use a hairdryer to evaporate any residual moisture. o If you use a hairdryer, use the cool or warm setting, to avoid potential heat injury to the skin.  Sitting on a pillow or ice pack may also provide relief. Do not apply ice directly to the skin, use a towel or pillow case as a buffer. We do not encourage sitting on an inflatable ring (doughnut) as this leads to unopposed downward pressure in the anal area.  You should gently rotate the Seton[place] other day or so, to prevent it from getting crusted with bodily fluids.  Occasionally, the SetonDelphina Cahillbecome displaced and fall out. If this occurs, it is not an emergency. Please call our office the next business day so that we may evaluate.  What symptoms should I expect?  It is normal to have pain for up to 1-2 weeks. Thereafter, you may notice discomfort with prolonged sitting and certain activities. Pain should not be constant or worsening.   Placement of Setons may stimulate mucus production so  the volume of drainage you are having may increase at first. The volume of drainage should lessen as healing occurs.

## 2022-01-12 ENCOUNTER — Telehealth (INDEPENDENT_AMBULATORY_CARE_PROVIDER_SITE_OTHER): Payer: Self-pay | Admitting: Internal Medicine

## 2022-01-12 NOTE — Telephone Encounter (Signed)
Patient called the office for a hospital follow up appointment - please get with Dr Laural Golden and find out how soon he would like to see patient - please advise

## 2022-01-12 NOTE — Telephone Encounter (Signed)
Dr Karilyn Cota said not next week but the week after so week of jan 30th - feb 3rd

## 2022-01-15 LAB — QUANTIFERON-TB GOLD PLUS

## 2022-01-18 ENCOUNTER — Other Ambulatory Visit: Payer: Self-pay

## 2022-01-18 ENCOUNTER — Ambulatory Visit (INDEPENDENT_AMBULATORY_CARE_PROVIDER_SITE_OTHER): Payer: 59 | Admitting: General Surgery

## 2022-01-18 ENCOUNTER — Encounter: Payer: Self-pay | Admitting: General Surgery

## 2022-01-18 VITALS — BP 120/83 | HR 66 | Temp 97.2°F | Resp 16 | Ht 74.0 in | Wt 238.0 lb

## 2022-01-18 DIAGNOSIS — K603 Anal fistula: Secondary | ICD-10-CM

## 2022-01-18 NOTE — Patient Instructions (Signed)
Continue sitz baths. Ok to return to work. Call with issues.

## 2022-01-18 NOTE — Progress Notes (Signed)
[facility]  Doing well and having minimal pain. Says it is draining. Two setons in place.  BP 120/83    Pulse 66    Temp (!) 97.2 F (36.2 C) (Other (Comment))    Resp 16    Ht 6' 2"  (1.88 m)    Wt 238 lb (108 kg)    SpO2 96%    BMI 30.56 kg/m  Setons in place, minor induration, drainage noted   Patient with setons in place for perianal abscess in the setting of Crohn's disease/ perianal disease.  Continue sitz baths. Ok to return to work. Call with issues.  Will discuss with Dr. Laural Golden the plan for Colorectal/ Preference.   Future Appointments  Date Time Provider [place]  01/30/2022 11:30 AM Rogene Houston, MD NRE-NRE None  02/14/2022  2:45 PM Virl Cagey, MD RS-RS None   Curlene Labrum, MD [facility] [address] [place], [place] [address] [phone] (office)

## 2022-01-30 ENCOUNTER — Encounter (INDEPENDENT_AMBULATORY_CARE_PROVIDER_SITE_OTHER): Payer: Self-pay | Admitting: Internal Medicine

## 2022-01-30 ENCOUNTER — Ambulatory Visit (INDEPENDENT_AMBULATORY_CARE_PROVIDER_SITE_OTHER): Payer: 59 | Admitting: Internal Medicine

## 2022-01-30 ENCOUNTER — Other Ambulatory Visit: Payer: Self-pay

## 2022-01-30 VITALS — BP 109/78 | HR 76 | Temp 97.9°F | Ht 74.0 in | Wt 231.7 lb

## 2022-01-30 DIAGNOSIS — K50819 Crohn's disease of both small and large intestine with unspecified complications: Secondary | ICD-10-CM | POA: Diagnosis not present

## 2022-01-30 DIAGNOSIS — K50913 Crohn's disease, unspecified, with fistula: Secondary | ICD-10-CM | POA: Diagnosis not present

## 2022-01-30 DIAGNOSIS — K219 Gastro-esophageal reflux disease without esophagitis: Secondary | ICD-10-CM

## 2022-01-30 MED ORDER — MESALAMINE 800 MG PO TBEC: 800.0000 mg | DELAYED_RELEASE_TABLET | Freq: Two times a day (BID) | ORAL | 5 refills | Status: DC

## 2022-01-30 MED ORDER — PANTOPRAZOLE SODIUM 40 MG PO TBEC: DELAYED_RELEASE_TABLET | ORAL | 3 refills | Status: AC

## 2022-01-30 MED ORDER — NICOTINE 7 MG/24HR TD PT24: 7.0000 mg | MEDICATED_PATCH | Freq: Every day | TRANSDERMAL | 0 refills | Status: DC

## 2022-01-30 MED ORDER — DICYCLOMINE HCL 10 MG PO CAPS: 10.0000 mg | ORAL_CAPSULE | Freq: Three times a day (TID) | ORAL | 2 refills | Status: DC | PRN

## 2022-01-30 MED ORDER — TRAMADOL HCL 50 MG PO TABS
50.0000 mg | ORAL_TABLET | Freq: Two times a day (BID) | ORAL | 0 refills | Status: DC | PRN
Start: 2022-01-30 — End: 2022-02-14

## 2022-01-30 MED ORDER — METRONIDAZOLE 500 MG PO TABS: 500.0000 mg | ORAL_TABLET | Freq: Two times a day (BID) | ORAL | 1 refills | Status: DC

## 2022-01-30 NOTE — Patient Instructions (Signed)
Physician will call with results of blood work and MR

## 2022-01-30 NOTE — Progress Notes (Signed)
Presenting complaint;  Follow-up for Crohn's disease.  Database and subjective:  Patient 51 year old Caucasian male who has a history of small and large bowel Crohn's disease maintained on oral mesalamine who was last seen on 01/09/2022 and noted to have extensive cellulitis involving right gluteal region and exam suggested fistulae and perianal abscess.  He was admitted.  He was begun on IV antibiotics.  MR confirmed an abscess and complicated fistulae.  He underwent exam under anesthesia by Dr. Blake Divine with drainage of an abscess and placement of 2 setons.  He was discharged on Augmentin but he only took it for 1 week.  He says he is feeling much better.  He is having some pain particularly when he is sitting for a while.  He has been using ibuprofen 6 to 800 mg at a time for pain.  He is not taking it every day.  He still has drainage of mucopurulent material with blood but is small amount.  Does not have an odor.  He has not had fever or chills.  His appetite is good but he has change eating habits since his A1c was 6.5 g.  Dr. Gerarda Fraction advised that he must lose weight otherwise he would end up on medications.  He states he has changes diet and he is also trying to walk more.  He is having 1 formed stool daily.  He has lost 7 pounds since his last visit. He states he is taking oral mesalamine every day.  Over the years he had been using it intermittently and was not compliant. He has not smoked cigarettes since his last admission.  He is requesting a prescription for nicotine patch.  Current Medications: Outpatient Encounter Medications as of 01/30/2022  Medication Sig   acetaminophen (TYLENOL) 325 MG tablet Take 2 tablets (650 mg total) by mouth every 6 (six) hours as needed for mild pain, fever or headache.   dicyclomine (BENTYL) 10 MG capsule TAKE 1 CAPSULE(10 MG) BY MOUTH THREE TIMES DAILY BEFORE MEALS (Patient taking differently: Take 10 mg by mouth 3 (three) times daily before meals.)    ibuprofen (ADVIL) 200 MG tablet Take 800 mg by mouth daily as needed for headache or moderate pain.   Melatonin 3 MG CAPS Take 3 mg by mouth at bedtime as needed (sleep).   Mesalamine 800 MG TBEC Take 1 tablet (800 mg total) by mouth in the morning and at bedtime.   nicotine (NICODERM CQ - DOSED IN MG/24 HOURS) 14 mg/24hr patch Place 1 patch (14 mg total) onto the skin daily.   pantoprazole (PROTONIX) 40 MG tablet TAKE 1 TABLET(40 MG) BY MOUTH DAILY   valACYclovir (VALTREX) 500 MG tablet Take 500 mg by mouth daily as needed (fever blisters).   [DISCONTINUED] polyethylene glycol (MIRALAX / GLYCOLAX) 17 g packet Take 17 g by mouth daily.   No facility-administered encounter medications on file as of 01/30/2022.     Objective: Blood pressure 109/78, pulse 76, temperature 97.9 F (36.6 C), temperature source Oral, height _0  (1.88 m), weight 231 lb 11.2 oz (105.1 kg). Patient is alert and in no acute distress. Conjunctiva is pink. Sclera is nonicteric Oropharyngeal mucosa is normal. No neck masses or thyromegaly noted. Cardiac exam with regular rhythm normal S1 and S2. No murmur or gallop noted. Lungs are clear to auscultation. Abdomen is symmetrical soft and nontender. Rectal examination was limited with external exam.  He still has scant amount of purulent discharge around setons.  Right gluteal area still with  erythema and induration but much less on 2 weeks ago. No LE edema or clubbing noted.  Labs/studies Results:   CBC Latest Ref Rng & Units 01/11/2022 01/10/2022 01/09/2022  WBC 4.0 - 10.5 K/uL 14.4(H) 13.2(H) 15.9(H)  Hemoglobin 13.0 - 17.0 g/dL 12.6(L) 13.3 14.3  Hematocrit 39.0 - 52.0 % 37.5(L) 39.6 41.8  Platelets 150 - 400 K/uL 355 383 441(H)    CMP Latest Ref Rng & Units 01/11/2022 01/10/2022 01/09/2022  Glucose 70 - 99 mg/dL 115(H) 110(H) 102(H)  BUN 6 - 20 mg/dL _0 Creatinine 0.61 - 1.24 mg/dL 0.84 0.77 0.83  Sodium 135 - 145 mmol/L 136 137 134(L)  Potassium 3.5 - 5.1  mmol/L 4.1 4.1 3.7  Chloride 98 - 111 mmol/L 103 105 102  CO2 22 - 32 mmol/L _1 Calcium 8.9 - 10.3 mg/dL 8.5(L) 8.7(L) 8.9  Total Protein 6.5 - 8.1 g/dL - - 8.0  Total Bilirubin 0.3 - 1.2 mg/dL - - 0.8  Alkaline Phos 38 - 126 U/L - - 79  AST 15 - 41 U/L - - 15  ALT 0 - 44 U/L - - 14    Hepatic Function Latest Ref Rng & Units 01/09/2022 09/29/2020  Total Protein 6.5 - 8.1 g/dL 8.0 7.7  Albumin 3.5 - 5.0 g/dL 3.7 -  AST 15 - 41 U/L 15 18  ALT 0 - 44 U/L 14 17  Alk Phosphatase 38 - 126 U/L 79 -  Total Bilirubin 0.3 - 1.2 mg/dL 0.8 0.6    Lab Results  Component Value Date   CRP 6.4 (H) 01/09/2022    Hepatitis B surface antigen was negative on 01/11/2022. QuantiFERON-TB gold plus was not completed as specimen was hemolyzed.  Assessment:  #1.  Chronic disease.  He was diagnosed at age 52.  He had mild indolent course maintained on oral mesalamine and now he has developed fistulizing Crohn's disease with an abscess for which she was hospitalized for I&D and IV antibiotics.  He has 2 setons in place.  He he remains with drainage of mucopurulent and sanguinous fluid.  He would therefore benefit from going back on an antibiotic.  He is still having pain and therefore will benefit from as needed tramadol which she took while in the hospital and did well.  He should not take NSAIDs.  #2.  GERD.  Heartburn is well controlled with PPI.  #3.  Mild anemia secondary to acute illness.  Plan:  Patient will go to the lab for CBC with differential, CRP and comprehensive chemistry panel. QuantiFERON-TB gold plus. NicoDerm patch 7 mg to skin daily.  28 doses without refill. Metronidazole 500 mg by mouth twice daily for 2 weeks.  Prescription given for 2 weeks with 1 refill. Tramadol 50 mg by mouth twice daily as needed.  Prescription given for 30 doses without refill.  Please note overdose risk score is 200. Schedule MR abdomen and pelvis. Office visit in 2 months or so.  I would like for him  to see him 1 month after he has received biologic.

## 2022-02-02 LAB — CBC WITH DIFFERENTIAL/PLATELET
Absolute Monocytes: 818 cells/uL (ref 200–950)
Basophils Absolute: 127 cells/uL (ref 0–200)
Basophils Relative: 0.9 %
Eosinophils Absolute: 508 cells/uL — ABNORMAL HIGH (ref 15–500)
Eosinophils Relative: 3.6 %
HCT: 39.5 % (ref 38.5–50.0)
Hemoglobin: 13.6 g/dL (ref 13.2–17.1)
Lymphs Abs: 2580 cells/uL (ref 850–3900)
MCH: 30.5 pg (ref 27.0–33.0)
MCHC: 34.4 g/dL (ref 32.0–36.0)
MCV: 88.6 fL (ref 80.0–100.0)
MPV: 10.2 fL (ref 7.5–12.5)
Monocytes Relative: 5.8 %
Neutro Abs: 10067 cells/uL — ABNORMAL HIGH (ref 1500–7800)
Neutrophils Relative %: 71.4 %
Platelets: 429 10*3/uL — ABNORMAL HIGH (ref 140–400)
RBC: 4.46 10*6/uL (ref 4.20–5.80)
RDW: 12 % (ref 11.0–15.0)
Total Lymphocyte: 18.3 %
WBC: 14.1 10*3/uL — ABNORMAL HIGH (ref 3.8–10.8)

## 2022-02-02 LAB — QUANTIFERON-TB GOLD PLUS
Mitogen-NIL: 8.13 IU/mL
NIL: 0.06 IU/mL
QuantiFERON-TB Gold Plus: NEGATIVE
TB1-NIL: <0 IU/mL
TB2-NIL: <0 IU/mL

## 2022-02-02 LAB — C-REACTIVE PROTEIN: CRP: 22 mg/L — ABNORMAL HIGH (ref <8.0)

## 2022-02-08 NOTE — Progress Notes (Signed)
Form was faxed on 2/15 and I called insurance and started PA on 2/15. Faxed notes and waiting for insurance response.

## 2022-02-12 ENCOUNTER — Other Ambulatory Visit (INDEPENDENT_AMBULATORY_CARE_PROVIDER_SITE_OTHER): Payer: Self-pay

## 2022-02-12 ENCOUNTER — Other Ambulatory Visit: Payer: Self-pay

## 2022-02-12 ENCOUNTER — Ambulatory Visit (HOSPITAL_COMMUNITY)
Admission: RE | Admit: 2022-02-12 | Discharge: 2022-02-12 | Disposition: A | Discharge status: Home / Self Care | Payer: 59 | Source: Ambulatory Visit | Attending: Internal Medicine | Admitting: Internal Medicine

## 2022-02-12 DIAGNOSIS — K50913 Crohn's disease, unspecified, with fistula: Secondary | ICD-10-CM

## 2022-02-12 IMAGING — MR MR PELVIS WO/W CM
15 of 16 series · 45 of 48 positions shown · IV contrast (gadavist)
Comparison: 01/09/2022

CLINICAL DATA: Follow-up perianal fistula.  Crohn disease.

EXAM:
MRI PELVIS WITHOUT AND WITH CONTRAST
TECHNIQUE: Multiplanar multisequence MR imaging of the pelvis was performed
both before and after administration of intravenous contrast.
CONTRAST:  10mL GADAVIST GADOBUTROL 1 MMOL/ML IV SOLN

[Series 2: T2 · sagittal · 3.0mm · 1.19mm/px · 3 of 35 slices shown (1 of 2)]
[im 1/35]
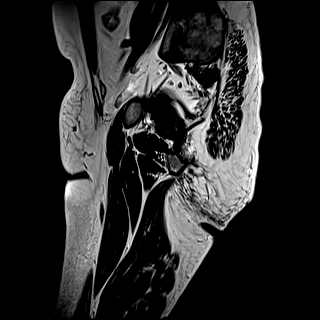
[im 18/35]
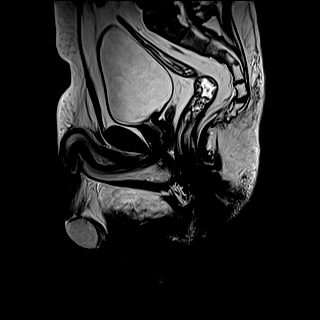
[im 35/35]
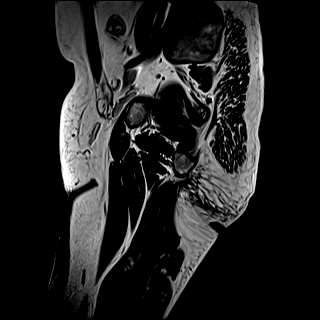

[Series 3: T2 fat-sat · sagittal · 3.0mm · 0.62mm/px · 3 of 35 slices shown (1 of 3)]
[im 1/35]
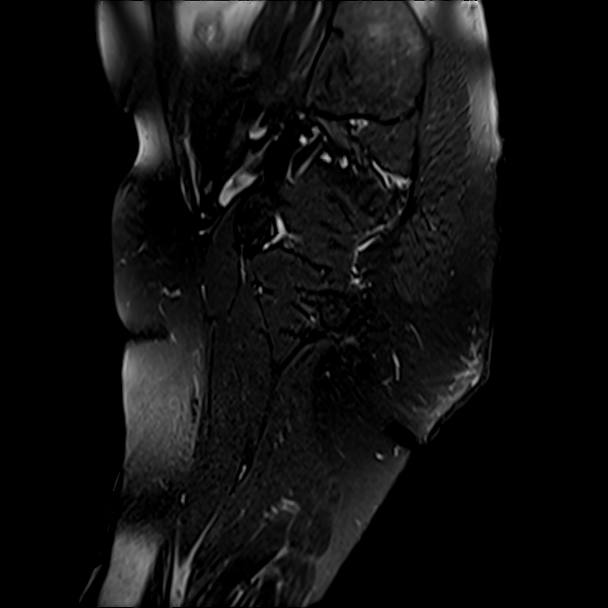
[im 18/35]
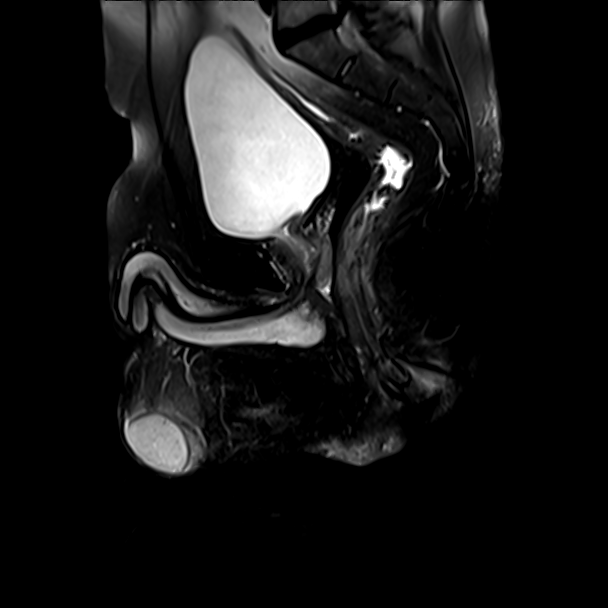
[im 35/35]
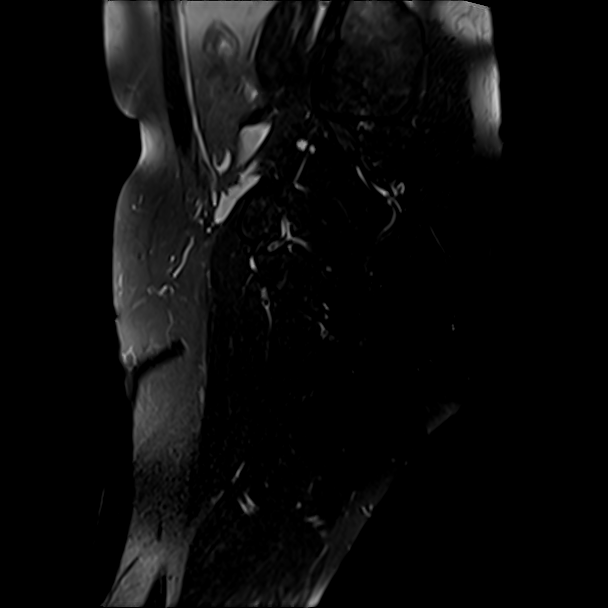

[Series 4: T1 · axial · 4.0mm · 0.86mm/px · z∈[-210,-58]mm · 2 of 35 slices shown]
[im 1/35]
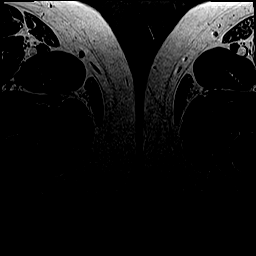
[im 35/35]
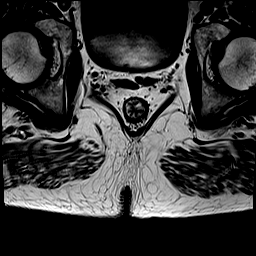

[Series 5: T2 · axial · 4.0mm · 0.70mm/px · z∈[-217,-65]mm · 2 of 35 slices shown (2 of 2)]
[im 1/35]
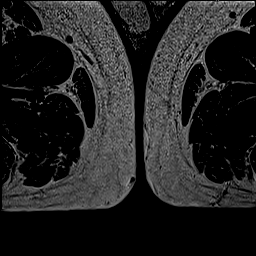
[im 35/35]
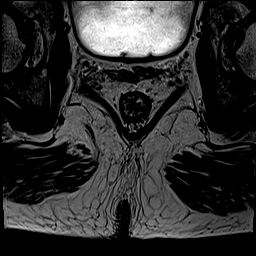

[Series 6: T2 fat-sat · axial · 4.0mm · 0.75mm/px · z∈[-206,-54]mm · 2 of 35 slices shown (2 of 3)]
[im 1/35]
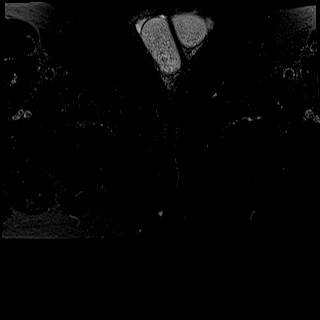
[im 35/35]
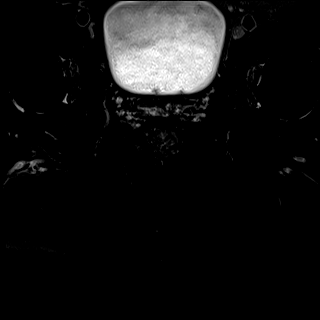

[Series 7: T1 fat-sat · axial · non-contrast · 4.0mm · 0.86mm/px · z∈[-210,-58]mm · 2 of 35 slices shown]
[im 1/35]
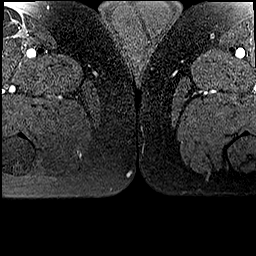
[im 35/35]
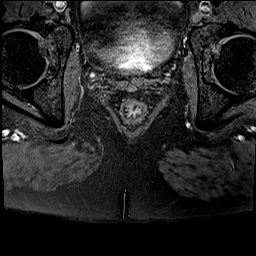

[Series 8: T2 fat-sat · coronal · 4.0mm · 0.75mm/px · 4 of 50 slices shown (3 of 3)]
[im 1/50]
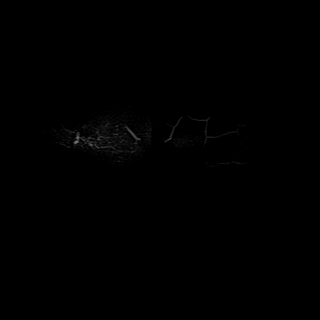
[im 17/50]
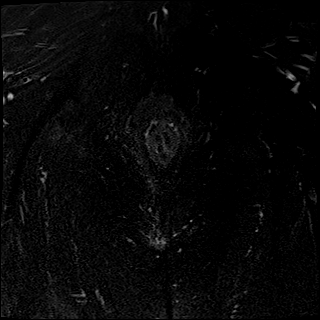
[im 33/50]
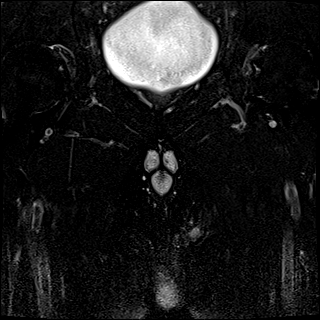
[im 50/50]
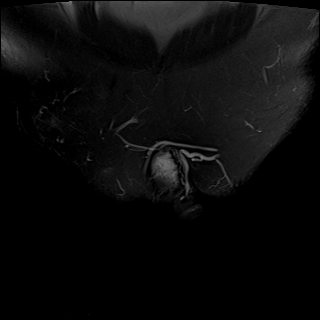

[Series 9: ax dwi_tracew · axial · 5.0mm · 1.48mm/px · z∈[-173,-37]mm · 2 of 30 slices shown (1 of 3)]
[im 1/30]
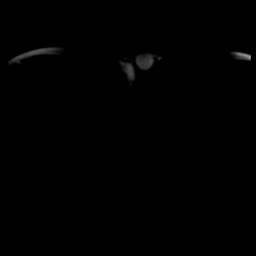
[im 30/30]
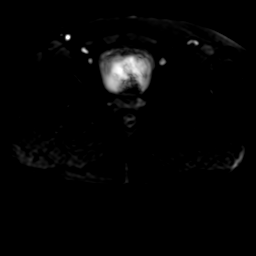

[Series 9: ax dwi_tracew · axial · 5.0mm · 1.48mm/px · z∈[-173,-37]mm · 2 of 30 slices shown (2 of 3)]
[im 1/30]
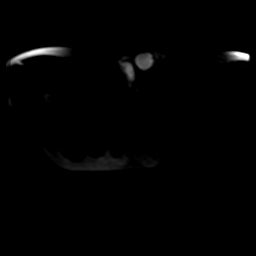
[im 30/30]
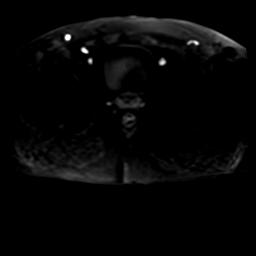

[Series 9: ax dwi_tracew · axial · 5.0mm · 1.48mm/px · z∈[-173,-37]mm · 2 of 30 slices shown (3 of 3)]
[im 1/30]
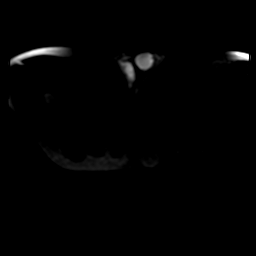
[im 30/30]
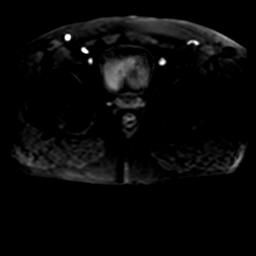

[Series 10: ax dwi_adc · axial · 5.0mm · 1.48mm/px · z∈[-173,-37]mm · 2 of 30 slices shown]
[im 1/30]
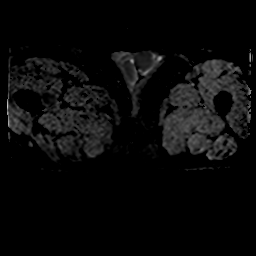
[im 30/30]
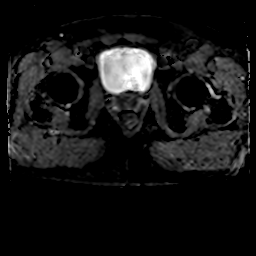

[Series 11: ax dwi_calc_bval · axial · 5.0mm · 1.48mm/px · z∈[-173,-37]mm · 2 of 30 slices shown]
[im 1/30]
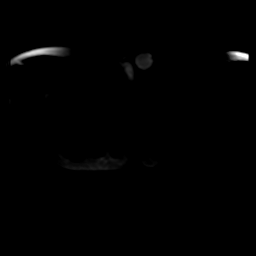
[im 30/30]
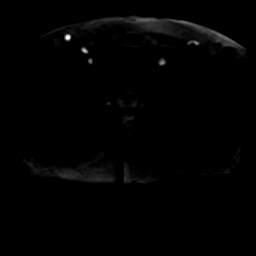

[Series 12: T1 dynamic · axial · non-contrast · 3.0mm · 1.19mm/px · z∈[-320,-83]mm · 6 of 80 slices shown (1 of 2)]
[im 1/80]
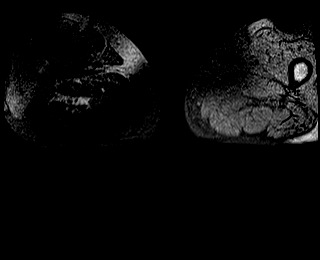
[im 16/80]
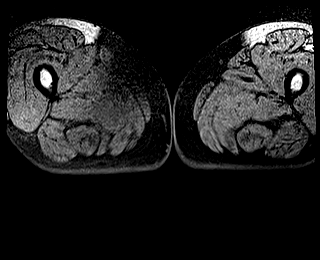
[im 32/80]
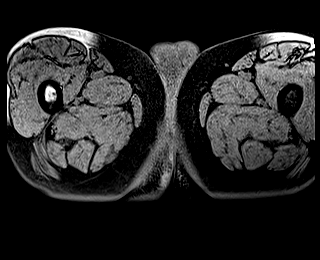
[im 48/80]
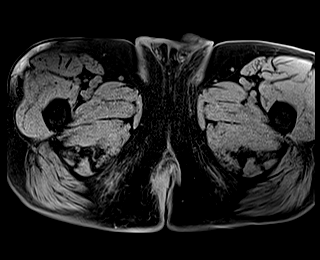
[im 64/80]
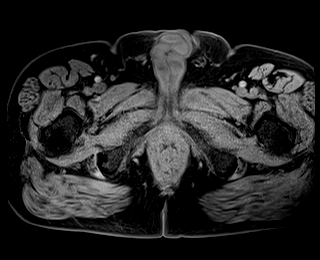
[im 80/80]
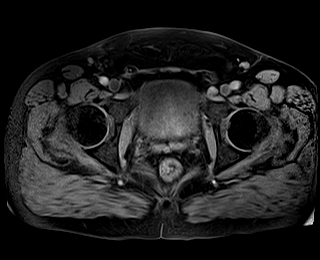

[Series 13: T1 dynamic post-contrast · axial · 3.0mm · 1.19mm/px · z∈[-320,-83]mm · 6 of 80 slices shown]
[im 1/80]
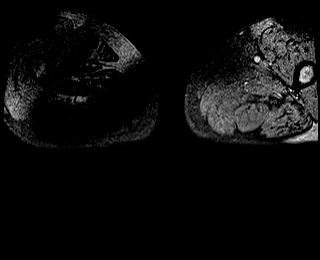
[im 16/80]
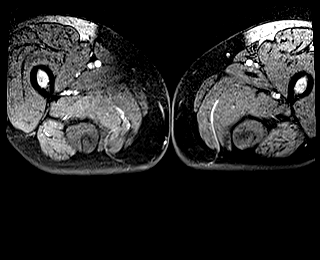
[im 32/80]
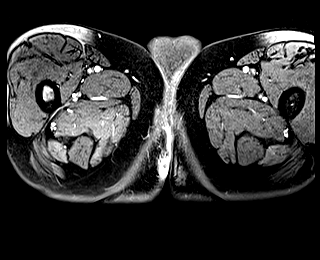
[im 48/80]
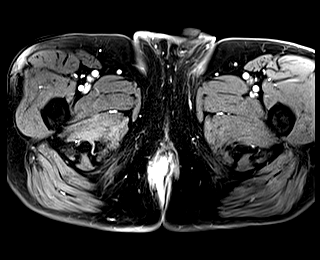
[im 64/80]
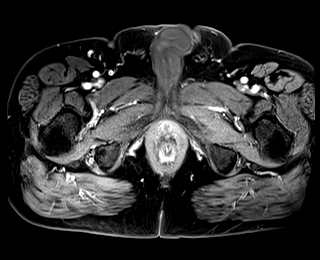
[im 80/80]
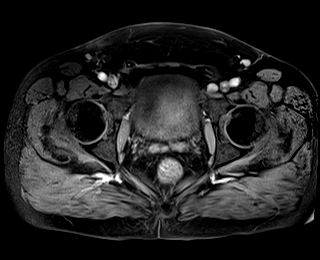

[Series 14: T1 dynamic · axial · 3.0mm · 1.19mm/px · z∈[-320,-131]mm · 5 of 80 slices shown (2 of 2)]
[im 1/80]
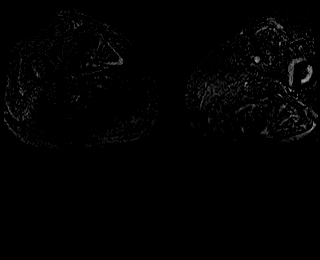
[im 16/80]
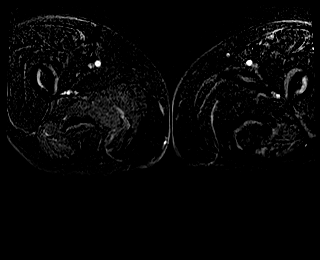
[im 32/80]
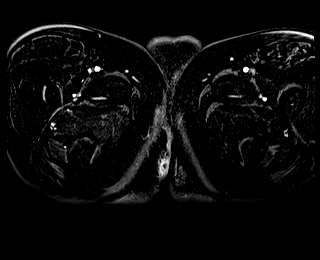
[im 48/80]
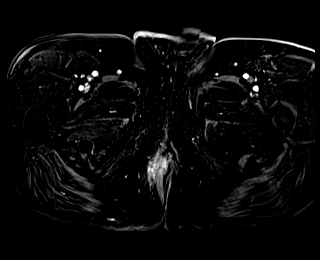
[im 64/80]
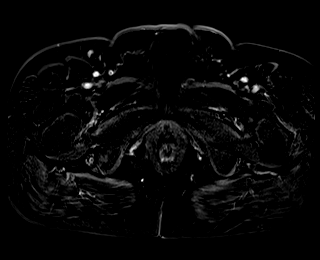

[45 of 48 positions shown; findings below may reference images not displayed]

FINDINGS: Lower Urinary Tract: Unremarkable urinary bladder.

Bowel: A right-sided intersphincteric fistula is again seen which
arises from the inferior anal canal at the 7 to 8 o'clock position.
A complex fistulous tract is seen which is shows extension into the
gluteal crease just below the anal sphincter, with a secondary
tracts showing further extension inferiorly and terminating in a
small rim enhancing abscess measuring 8 mm. This has significantly
decreased in size from 2.7 cm previously, and there is decreased
soft tissue edema within the adjacent gluteal subcutaneous fat.

In addition, there is a left-sided simple appearing fistula below
the level of the anus, which runs horizontally in the fold between
the perineum and medial upper left thigh, and measures approximately
8-9 cm in length. (See image 28/15). This shows no communication
with the anal sphincter or associated abscess. This appears to have
been present on prior exam, but was less well visualized due to
smaller field-of-view on prior exam.

Vascular/Lymphatic: No pathologically enlarged lymph nodes or other
significant abnormality seen in lower pelvis.

Reproductive:  No mass or other significant abnormality.

Other: None.

Musculoskeletal: No significant abnormality identified.
IMPRESSION: Interval improvement in complex right-sided intersphincteric
perianal fistula, with significant decrease in size of associated
abscess. ([place] [HOSPITAL] classification grade 2)

Left-sided simple appearing fistula running horizontally in the soft
tissue fold between the perineum and medial upper left thigh,
without communication with the anal sphincter.

## 2022-02-12 MED ORDER — GADOBUTROL 1 MMOL/ML IV SOLN
10.0000 mL | Freq: Once | INTRAVENOUS | Status: AC | PRN
  Administered 2022-02-12: 10 mL via INTRAVENOUS

## 2022-02-12 NOTE — Progress Notes (Signed)
Mr

## 2022-02-14 ENCOUNTER — Ambulatory Visit (INDEPENDENT_AMBULATORY_CARE_PROVIDER_SITE_OTHER): Payer: 59 | Admitting: General Surgery

## 2022-02-14 ENCOUNTER — Other Ambulatory Visit: Payer: Self-pay

## 2022-02-14 ENCOUNTER — Encounter: Payer: Self-pay | Admitting: General Surgery

## 2022-02-14 VITALS — BP 127/86 | HR 87 | Temp 98.3°F | Resp 16 | Ht 74.0 in | Wt 231.0 lb

## 2022-02-14 DIAGNOSIS — K603 Anal fistula: Secondary | ICD-10-CM

## 2022-02-14 NOTE — Patient Instructions (Signed)
Referral to Dr. Morton Stall, Colorectal. Will make sure he has the MRI and all of my documentation.  Continue sitz baths as needed.

## 2022-02-15 ENCOUNTER — Other Ambulatory Visit (INDEPENDENT_AMBULATORY_CARE_PROVIDER_SITE_OTHER): Payer: Self-pay | Admitting: Internal Medicine

## 2022-02-15 MED ORDER — METRONIDAZOLE 500 MG PO TABS: 500.0000 mg | ORAL_TABLET | Freq: Two times a day (BID) | ORAL | 1 refills | Status: DC

## 2022-02-15 MED ORDER — CIPROFLOXACIN HCL 500 MG PO TABS: 500.0000 mg | ORAL_TABLET | Freq: Two times a day (BID) | ORAL | 1 refills | Status: DC

## 2022-02-16 ENCOUNTER — Telehealth: Payer: Self-pay | Admitting: *Deleted

## 2022-02-16 DIAGNOSIS — K50819 Crohn's disease of both small and large intestine with unspecified complications: Secondary | ICD-10-CM

## 2022-02-16 DIAGNOSIS — K603 Anal fistula: Secondary | ICD-10-CM

## 2022-02-16 DIAGNOSIS — K50913 Crohn's disease, unspecified, with fistula: Secondary | ICD-10-CM

## 2022-02-16 DIAGNOSIS — Z8719 Personal history of other diseases of the digestive system: Secondary | ICD-10-CM

## 2022-02-16 NOTE — Telephone Encounter (Signed)
Referral orders placed for colorectal surgeon, Dr. Lenise Arena at [facility] for Crohn's Disease, anal fistula x2 with Delphina Cahill.

## 2022-02-17 NOTE — Progress Notes (Signed)
[facility] Note   HPI:  51 y.o. Male presents to clinic for follow-up evaluation of his fistula in ano in the setting of Crohn's.   To summarize the recent history, the patient has long standing Crohn's since age 63 on mesalamine but had not had a flare or issue in years per report. He was seen by Dr. Laural Golden, GI 01/09/2022 with the patient reporting a flare in his hemorrhoids. He described swelling and drainage for over 2 weeks. The patient was seen in the office and noted to have an abscess and was sent to the ED. MRI was done that demonstrated abscess and fistula 01/09/2022.   I completed my exam and took him to the OR on 01/10/2022. The patient had denied any prior perianal disease but on exam it did not look like that was likely the case with some scarring and old pits that no longer tracked.  I drained his abscess and placed two setons in fistulas that I thought were intersphincteric.  He was discharged home on 01/11/2022 with plans to start a biologic in the upcoming weeks.   I saw him in the clinic 01/18/2022 and he was doing better, doing sitz baths and wanted to return to work. I discussed the case with Dr. Laural Golden and he felt that Dr. Morton Stall would be the most appropriate Colorectal/ Crohn's expert to send the patient too.   Dr. Laural Golden saw him back 01/30/22 and initiated the antibiotics again. The GI office was getting approval for a biologic. I believe the patient reported Skyrizi but I cannot find the specific biologic in Dr. Olevia Perches notes. Dr. Laural Golden ordered a repeat MRI that demonstrated the intersphincteric fistula and almost resolved abscess 62m. The patient continues to have drainage.   Today he says since he started the Cipro flagyl 2/7 the drainage is improved. He is still working and doing fair. He is wanting to get started on his biologic but I awaiting insurance approval. He understands he will need Colorectal to determine the best plans for these fistulas.   Review  of Systems:  Drainage from setons Improved pain and swelling  All other review of systems: otherwise negative   Vital Signs:  BP 127/86    Pulse 87    Temp 98.3 F (36.8 C) (Other (Comment))    Resp 16    Ht 6' 2"  (1.88 m)    Wt 231 lb (104.8 kg)    SpO2 95%    BMI 29.66 kg/m    Physical Exam:  Physical Exam Vitals reviewed.  Constitutional:      Appearance: Normal appearance.  HENT:     Nose: Nose normal.  Eyes:     Pupils: Pupils are equal, round, and reactive to light.  Cardiovascular:     Rate and Rhythm: Normal rate.  Abdominal:     General: There is no distension.     Palpations: Abdomen is soft.     Tenderness: There is no abdominal tenderness.  Genitourinary:    Comments: 2 setons in place, easily move, no induration or tenderness, drainage noted  Skin:    General: Skin is warm.  Neurological:     General: No focal deficit present.     Mental Status: He is alert.   MRI 2/20 reviewed personally  CLINICAL DATA:  Follow-up perianal fistula.  Crohn disease.   EXAM: MRI PELVIS WITHOUT AND WITH CONTRAST   TECHNIQUE: Multiplanar multisequence MR imaging of the pelvis was performed both before and after administration of  intravenous contrast.   CONTRAST:  46m GADAVIST GADOBUTROL 1 MMOL/ML IV SOLN   COMPARISON:  01/09/2022   FINDINGS: Lower Urinary Tract: Unremarkable urinary bladder.   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 A complex fistulous tract is seen which is shows extension into the gluteal crease just below the anal sphincter, with a secondary tracts showing further extension inferiorly and terminating in a small rim enhancing abscess measuring 8 mm. This has significantly decreased in size from 2.7 cm previously, and there is decreased soft tissue edema within the adjacent gluteal subcutaneous fat.   In addition, there is a left-sided simple appearing  fistula below the level of the anus, which runs horizontally in the fold between the perineum and medial upper left thigh, and measures approximately 8-9 cm in length. (See image 28/15). This shows no communication with the anal sphincter or associated abscess. This appears to have been present on prior exam, but was less well visualized due to smaller field-of-view on prior exam.   Vascular/Lymphatic: No pathologically enlarged lymph nodes or other significant abnormality seen in lower pelvis.   Reproductive:  No mass or other significant abnormality.   Other: None.   Musculoskeletal: No significant abnormality identified.   IMPRESSION: Interval improvement in complex right-sided intersphincteric perianal fistula, with significant decrease in size of associated abscess. ([facility]   Left-sided simple appearing fistula running horizontally in the soft tissue fold between the perineum and medial upper left thigh, without communication with the anal sphincter.     Electronically Signed   By: JMarlaine HindM.D.   On: 02/14/2022 12:18    Assessment:  51y.o. yo Male with fistula in ano, one that is simple and one that is intersphincteric in nature, continued drainage with a 847mcollection remaining. Doing fair clinically. Needs to start biologic but Dr. ReLaural Goldenolding given the drainage and collection.     Plan:   [facility]allt [facility] office to do the following:  Send to Dr. WaMorton StallDr. ReOlevia Perchesffice note 01/09/2022, Dr. BrConstance Hawonsult note 01/10/2022, Dr. BrConstance Hawperative note 01/10/2022, MRI report 01/09/2022, MRI report 02/12/2022, Dr. BrConstance Hawffice note 01/18/2022, Dr. ReLaural Goldenffice note 01/30/2022, Dr. BrConstance Hawffice note 02/14/22  [facility] the [facility] Dr. WaMorton Stall Will plan to have appt on the books pending the date that  patient is able to see Dr. WaMorton StallTold patient to reach out with any questions or concerns.  Sitz baths  Future Appointments  Date Time Provider [place]4/20/2023  9:00 AM CaHarvel QualeMD NRE-NRE None  04/18/2022  3:15 PM BrVirl CageyMD RS-RS None     LiCurlene LabrumMD [facility]tBell [place]le[place] [phone])

## 2022-02-26 ENCOUNTER — Other Ambulatory Visit (INDEPENDENT_AMBULATORY_CARE_PROVIDER_SITE_OTHER): Payer: Self-pay | Admitting: Internal Medicine

## 2022-03-13 ENCOUNTER — Other Ambulatory Visit (INDEPENDENT_AMBULATORY_CARE_PROVIDER_SITE_OTHER): Payer: Self-pay

## 2022-03-19 ENCOUNTER — Telehealth: Payer: Self-pay

## 2022-03-19 NOTE — Telephone Encounter (Signed)
Received fax for a Skyrizi infusion.  Left message to [name] them we do not currently offer [facility] [employer].  ?

## 2022-03-20 NOTE — Progress Notes (Signed)
Did not hear back from [facility]. [facility] rheumatology did call back and said as of now they are not doing skyrizi infusion but will be in the next week or two. Pt's mri is 4/7 and then dr Karilyn Cota will decide if pt can start skyrizi but asked to go ahead and get auth. Order form for [facility] rheumatology filled out and need dr Karilyn Cota to sign and then will fax to [facility] rheumatology.

## 2022-03-22 ENCOUNTER — Other Ambulatory Visit (INDEPENDENT_AMBULATORY_CARE_PROVIDER_SITE_OTHER): Payer: Self-pay | Admitting: Internal Medicine

## 2022-03-22 DIAGNOSIS — K50913 Crohn's disease, unspecified, with fistula: Secondary | ICD-10-CM

## 2022-03-22 DIAGNOSIS — K50819 Crohn's disease of both small and large intestine with unspecified complications: Secondary | ICD-10-CM

## 2022-03-27 ENCOUNTER — Other Ambulatory Visit (INDEPENDENT_AMBULATORY_CARE_PROVIDER_SITE_OTHER): Payer: Self-pay | Admitting: *Deleted

## 2022-03-30 ENCOUNTER — Ambulatory Visit (HOSPITAL_COMMUNITY): Admission: RE | Admit: 2022-03-30 | Payer: 59 | Source: Ambulatory Visit

## 2022-03-31 ENCOUNTER — Ambulatory Visit (HOSPITAL_COMMUNITY)
Admission: RE | Admit: 2022-03-31 | Discharge: 2022-03-31 | Disposition: A | Discharge status: Home / Self Care | Payer: 59 | Source: Ambulatory Visit | Attending: Internal Medicine | Admitting: Internal Medicine

## 2022-03-31 DIAGNOSIS — K50913 Crohn's disease, unspecified, with fistula: Secondary | ICD-10-CM | POA: Insufficient documentation

## 2022-03-31 DIAGNOSIS — K50819 Crohn's disease of both small and large intestine with unspecified complications: Secondary | ICD-10-CM | POA: Diagnosis not present

## 2022-03-31 IMAGING — MR MR ^MR ENTEROGRAPHY W/WO
32 of 39 series · 41 of 48 positions shown · IV contrast (gadavist)
Comparison: MR pelvis, 02/12/2022, CT abdomen pelvis, 03/14/2020

CLINICAL DATA: Crohn's disease, perianal fistula

EXAM:
MR ABDOMEN AND PELVIS WITHOUT AND WITH CONTRAST (MR ENTEROGRAPHY)
TECHNIQUE: Multiplanar, multisequence MRI of the abdomen and pelvis was
performed both before and during bolus administration of intravenous
contrast. Negative oral contrast VoLumen was given.
CONTRAST:  10mL GADAVIST GADOBUTROL 1 MMOL/ML IV SOLN

[Series 4: T2 · axial · 6.0mm · 1.56mm/px · 1 of 71 slices shown (1 of 2)]
[im 1/71]
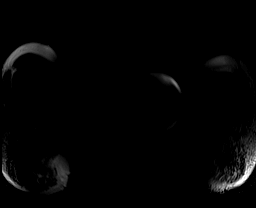

[Series 5: T2 · coronal · 6.0mm · 1.88mm/px · 1 of 35 slices shown (2 of 2)]
[im 1/35]
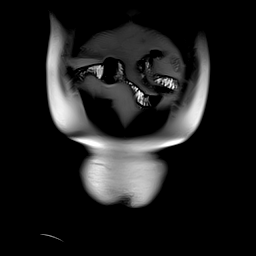

[Series 6: bSSFP · coronal · 6.0mm · 0.88mm/px · 1 of 35 slices shown]
[im 1/35]
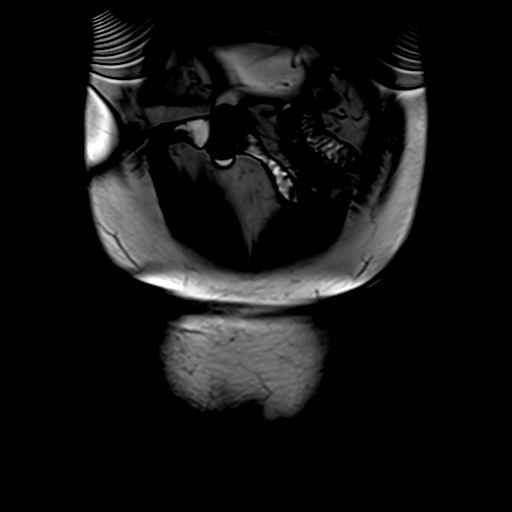

[Series 7: DWI · axial · 6.0mm · 1.49mm/px · 1 of 130 slices shown (1 of 2)]
[im 1/130]
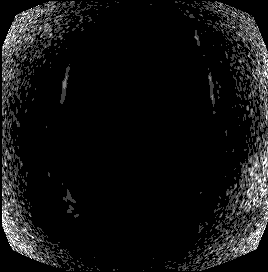

[Series 8: DWI · axial · 6.0mm · 1.49mm/px · 1 of 65 slices shown (2 of 2)]
[im 1/65]
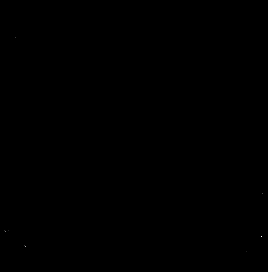

[Series 9: T1 · axial · 3.0mm · 1.25mm/px · 1 of 160 slices shown (1 of 2)]
[im 1/160]
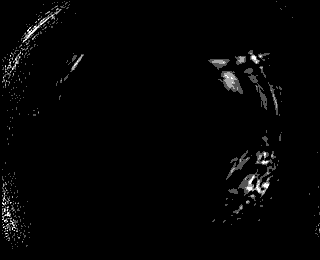

[Series 10: T1 · axial · 3.0mm · 1.25mm/px · 1 of 160 slices shown (2 of 2)]
[im 1/160]
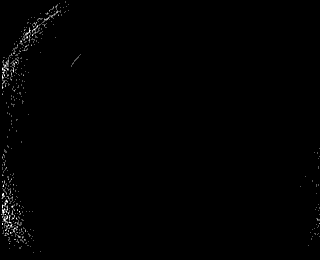

[Series 11: T1 dynamic · axial · 3.0mm · 1.25mm/px · 1 of 160 slices shown (1 of 20)]
[im 1/160]
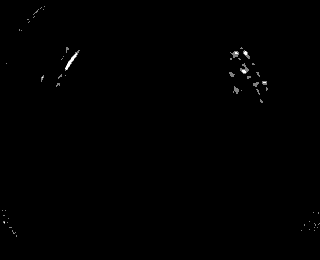

[Series 12: T1 dynamic · axial · 3.0mm · 1.25mm/px · 1 of 160 slices shown (2 of 20)]
[im 1/160]
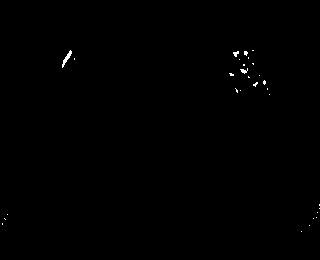

[Series 14: T1 dynamic · axial · 3.0mm · 1.25mm/px · 1 of 160 slices shown (3 of 20)]
[im 1/160]
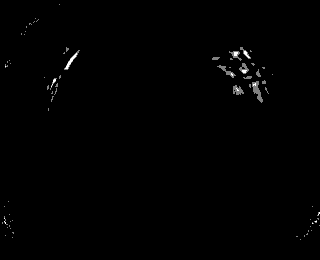

[Series 15: T1 dynamic · axial · 3.0mm · 1.25mm/px · 1 of 151 slices shown (4 of 20)]
[im 1/151]
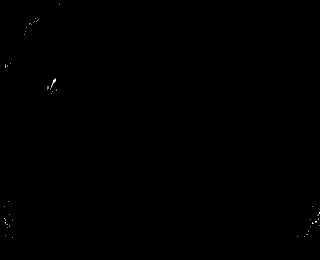

[Series 16: T1 dynamic · axial · 3.0mm · 1.25mm/px · 1 of 160 slices shown (5 of 20)]
[im 1/160]
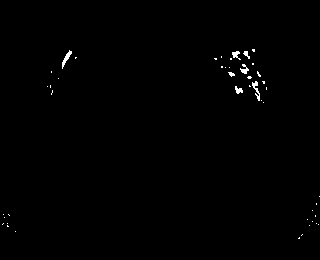

[Series 17: T1 dynamic · axial · 3.0mm · 1.25mm/px · 1 of 160 slices shown (6 of 20)]
[im 1/160]
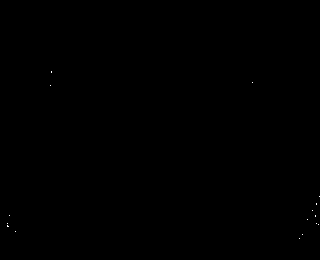

[Series 18: T1 dynamic · axial · 3.0mm · 1.25mm/px · 1 of 160 slices shown (7 of 20)]
[im 1/160]
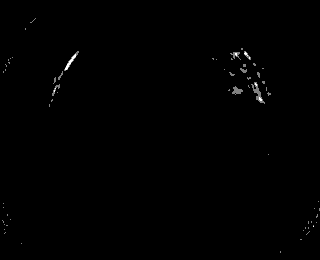

[Series 19: T1 dynamic · axial · 3.0mm · 1.25mm/px · 1 of 155 slices shown (8 of 20)]
[im 1/155]
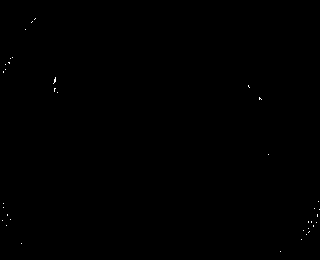

[Series 20: T1 dynamic · axial · 3.0mm · 1.25mm/px · 1 of 160 slices shown (9 of 20)]
[im 1/160]
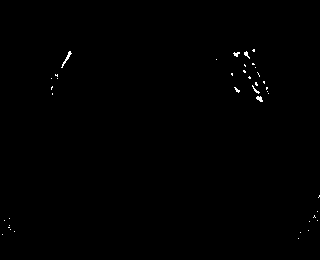

[Series 21: T1 dynamic · axial · 3.0mm · 1.25mm/px · z∈[-198,+279]mm · 2 of 160 slices shown (10 of 20)]
[im 1/160]
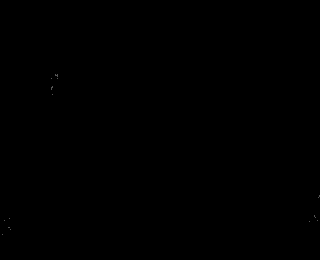
[im 160/160]
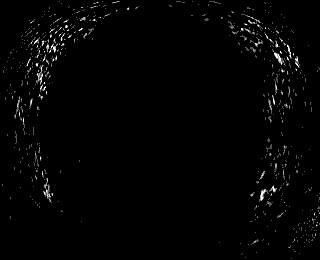

[Series 22: T1 dynamic · axial · 3.0mm · 1.25mm/px · z∈[-198,+279]mm · 2 of 160 slices shown (11 of 20)]
[im 1/160]
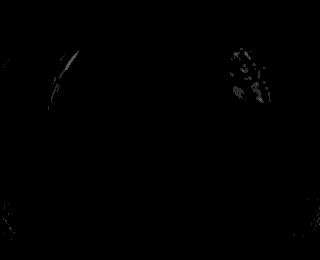
[im 160/160]
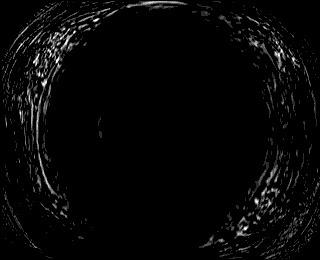

[Series 23: T1 dynamic · axial · 3.0mm · 1.25mm/px · z∈[-198,+279]mm · 2 of 160 slices shown (12 of 20)]
[im 1/160]
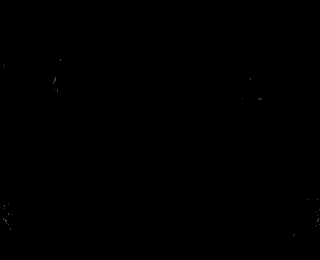
[im 160/160]
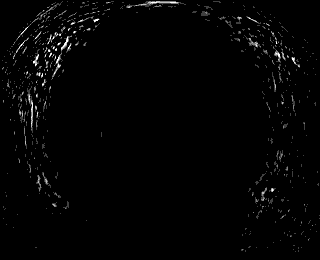

[Series 24: T1 dynamic · axial · 3.0mm · 1.25mm/px · z∈[-198,+279]mm · 2 of 160 slices shown (13 of 20)]
[im 1/160]
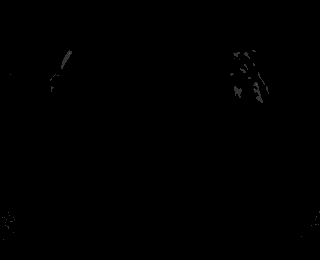
[im 160/160]
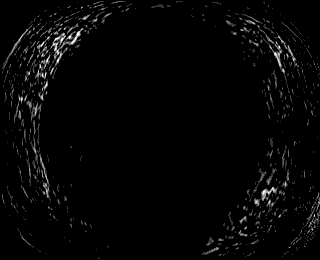

[Series 25: T1 dynamic · axial · 3.0mm · 1.25mm/px · z∈[-198,+279]mm · 2 of 160 slices shown (14 of 20)]
[im 1/160]
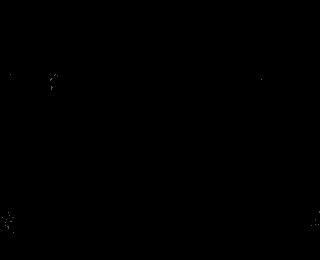
[im 160/160]
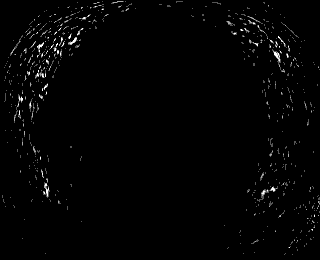

[Series 26: T1 dynamic · coronal · 3.0mm · 1.48mm/px · 1 of 80 slices shown (15 of 20)]
[im 1/80]
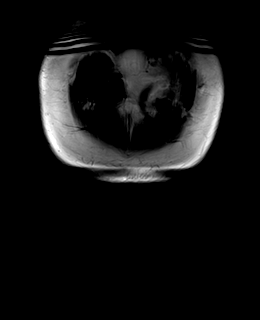

[Series 27: T1 dynamic · coronal · 3.0mm · 1.48mm/px · 1 of 80 slices shown (16 of 20)]
[im 1/80]
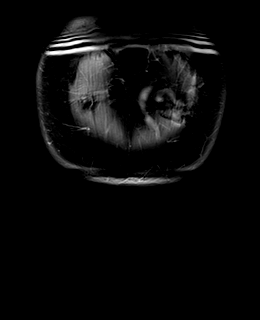

[Series 28: T1 dynamic · axial · 3.0mm · 1.25mm/px · z∈[-198,+279]mm · 2 of 160 slices shown (17 of 20)]
[im 1/160]
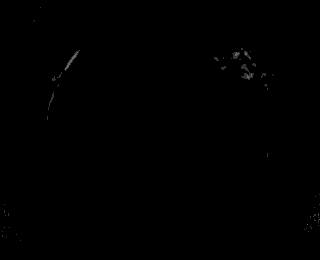
[im 160/160]
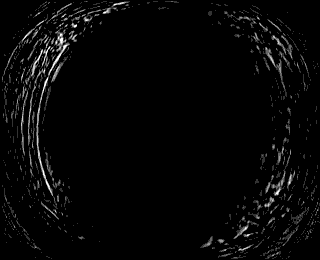

[Series 29: T1 dynamic · axial · 3.0mm · 1.25mm/px · z∈[-198,+279]mm · 2 of 149 slices shown (18 of 20)]
[im 1/149]
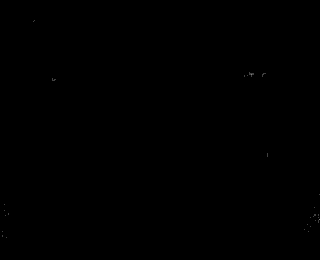
[im 149/149]
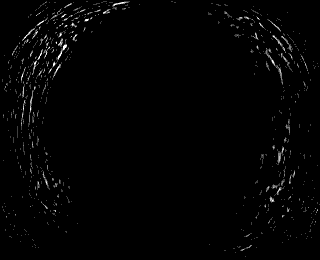

[Series 30: T1 dynamic · axial · 3.0mm · 1.25mm/px · z∈[-198,+279]mm · 2 of 160 slices shown (19 of 20)]
[im 1/160]
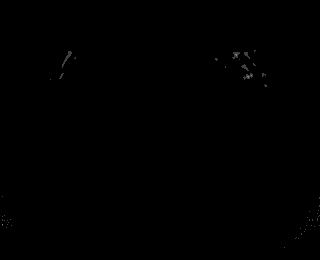
[im 160/160]
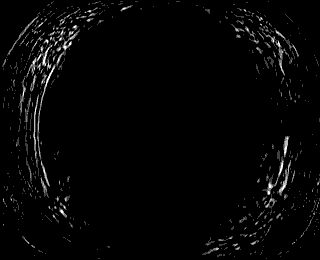

[Series 31: T1 dynamic · axial · 3.0mm · 1.25mm/px · z∈[-198,+279]mm · 2 of 160 slices shown (20 of 20)]
[im 1/160]
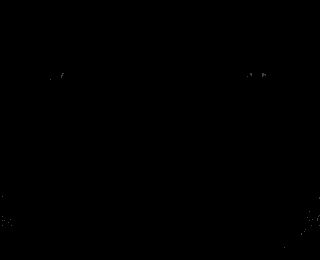
[im 160/160]
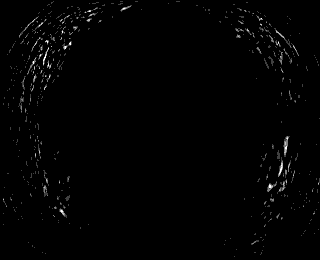

[Series 32: cor cine true-fisp · coronal · 10.0mm · 0.74mm/px · 1 of 100 slices shown (1 of 5)]
[im 1/100]
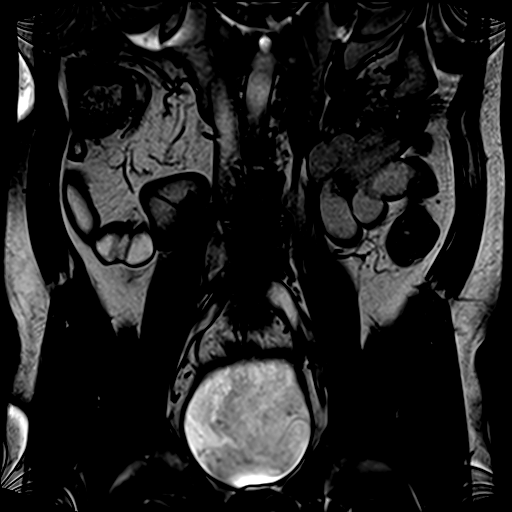

[Series 33: cor cine true-fisp · coronal · 10.0mm · 0.74mm/px · 1 of 100 slices shown (2 of 5)]
[im 1/100]
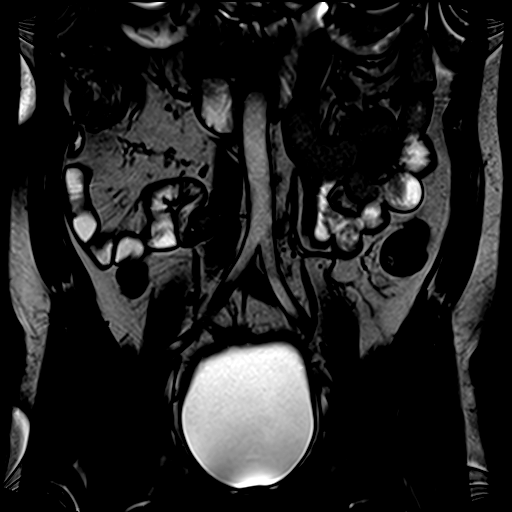

[Series 34: cor cine true-fisp · coronal · 10.0mm · 0.74mm/px · 1 of 100 slices shown (3 of 5)]
[im 1/100]
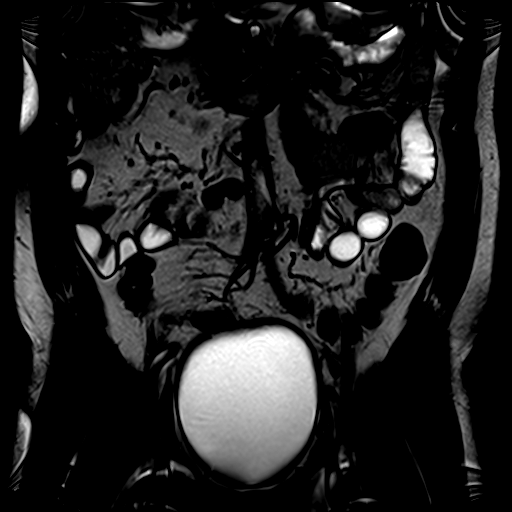

[Series 35: cor cine true-fisp · coronal · 10.0mm · 0.74mm/px · 1 of 100 slices shown (4 of 5)]
[im 1/100]
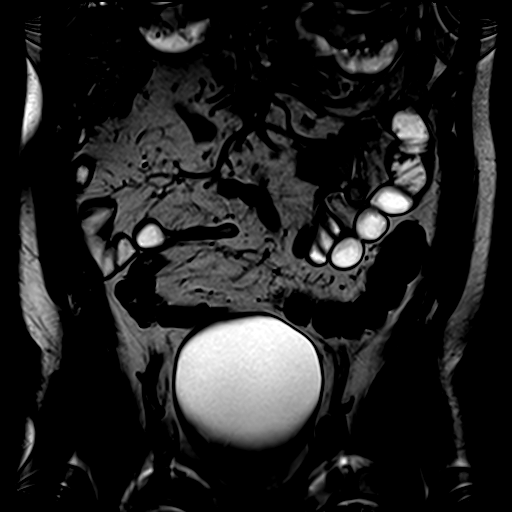

[Series 36: cor cine true-fisp · coronal · 10.0mm · 0.74mm/px · 1 of 100 slices shown (5 of 5)]
[im 1/100]
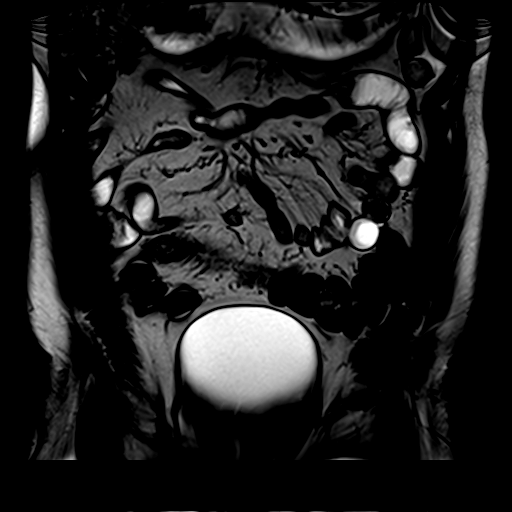

[41 of 48 positions shown; findings below may reference images not displayed]

FINDINGS: COMBINED FINDINGS FOR BOTH MR ABDOMEN AND PELVIS

Lower chest: No acute findings.

Hepatobiliary: No mass or other parenchymal abnormality identified.
No gallstones. No biliary ductal dilatation.

Pancreas: No mass, inflammatory changes, or other parenchymal
abnormality identified.No pancreatic ductal dilatation.

Spleen:  Within normal limits in size and appearance.

Adrenals/Urinary Tract: Normal adrenal glands. Simple and thinly
septated, benign right renal cysts, for which no further follow-up
or characterization is required. No renal masses or suspicious
contrast enhancement identified. No evidence of hydronephrosis.
Unremarkable urinary bladder.

Stomach/Bowel: Unchanged, long segment wall thickening and narrowing
of the terminal ileum, involving a serpiginous segment,
approximately 25 cm in length from the ileocecal valve (series 5,
image 16). Mild mucosal hyperenhancement without overt inflammatory
fat stranding. Normal appendix (series 3, image 20).

Vascular/Lymphatic: No pathologically enlarged lymph nodes
identified. No abdominal aortic aneurysm demonstrated.

Reproductive: No mass or other abnormality.

Other: Dedicated anal fistula protocol images of the pelvis were not
acquired; due to field of view limitations and field inhomogeneity
artifact at the inferior extent of the study, the patient's anal
fistula is not meaningfully assessed.

Musculoskeletal: No suspicious osseous lesions identified.
IMPRESSION: 1. Unchanged, long segment wall thickening and narrowing of the
terminal ileum, involving a segment approximately 25 cm in length
from the ileocecal valve. Mild mucosal hyperenhancement without
overt inflammatory fat stranding. Findings are compatible with
Crohn's disease, without evidence of overt stricture or complicating
obstruction, fistula, or abscess.
2. Dedicated anal fistula protocol images of the pelvis were not
acquired; due to field of view limitations and field inhomogeneity
artifact at the inferior extent of the study provided for review,
the patient's known anal fistula is not meaningfully assessed.
Patient will be requested to return for the appropriate images and
this exam will be addended with evaluation of the pelvis.

## 2022-03-31 IMAGING — MR MR ENTEROGRAPHY W/ CM
35 of 49 series · 35 of 49 positions shown · IV contrast (gadavist)
Comparison: 02/12/2022

CLINICAL DATA: Crohn's disease, perianal fistula

EXAM:
MRI PELVIS WITHOUT AND WITH CONTRAST
TECHNIQUE: Multiplanar multisequence MR imaging of the pelvis was performed
both before and after administration of intravenous contrast.
CONTRAST:  10 mL Gadavist gadolinium contrast IV

[Series 3: T2 · axial · 6.0mm · 1.56mm/px · 1 of 71 slices shown (1 of 2)]
[im 1/71]
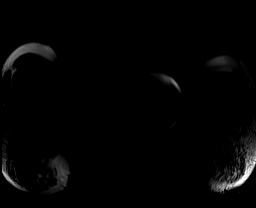

[Series 4: T2 · coronal · 6.0mm · 1.88mm/px · 1 of 35 slices shown (2 of 2)]
[im 1/35]
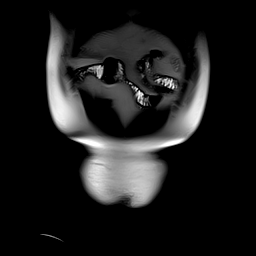

[Series 5: bSSFP · coronal · 6.0mm · 0.88mm/px · 1 of 35 slices shown]
[im 1/35]
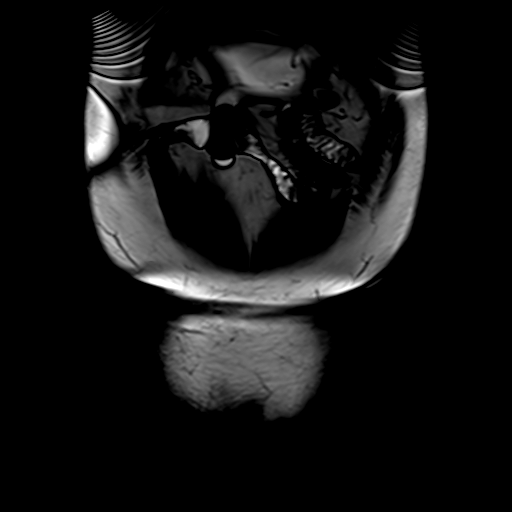

[Series 6: DWI · axial · 6.0mm · 1.49mm/px · 1 of 130 slices shown (1 of 2)]
[im 1/130]
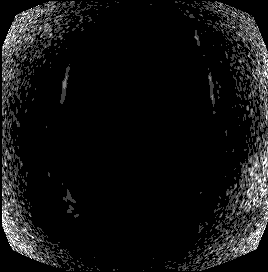

[Series 7: DWI · axial · 6.0mm · 1.49mm/px · 1 of 65 slices shown (2 of 2)]
[im 1/65]
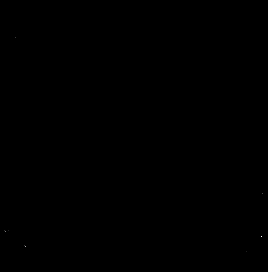

[Series 8: T1 · axial · 3.0mm · 1.25mm/px · 1 of 160 slices shown (1 of 2)]
[im 1/160]
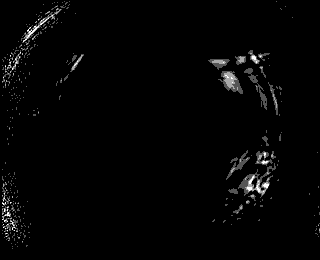

[Series 9: T1 · axial · 3.0mm · 1.25mm/px · 1 of 160 slices shown (2 of 2)]
[im 1/160]
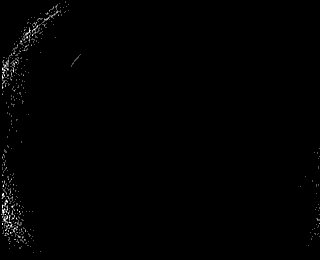

[Series 10: T1 dynamic · axial · 3.0mm · 1.25mm/px · 1 of 160 slices shown (1 of 20)]
[im 1/160]
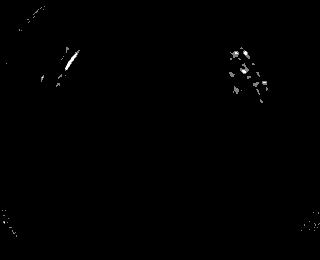

[Series 11: T1 dynamic · axial · 3.0mm · 1.25mm/px · 1 of 160 slices shown (2 of 20)]
[im 1/160]
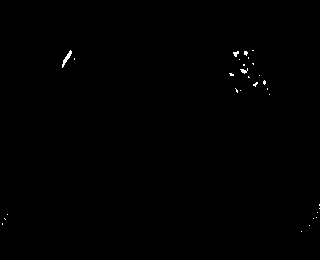

[Series 13: T1 dynamic · axial · 3.0mm · 1.25mm/px · 1 of 160 slices shown (3 of 20)]
[im 1/160]
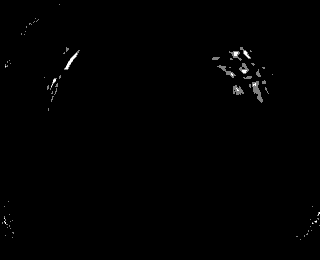

[Series 14: T1 dynamic · axial · 3.0mm · 1.25mm/px · 1 of 151 slices shown (4 of 20)]
[im 1/151]
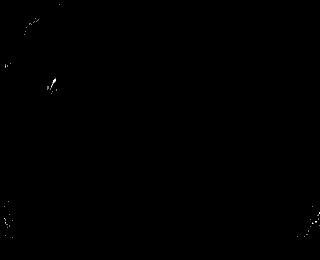

[Series 15: T1 dynamic · axial · 3.0mm · 1.25mm/px · 1 of 160 slices shown (5 of 20)]
[im 1/160]
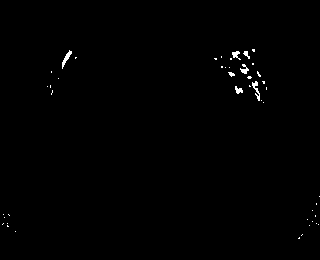

[Series 16: T1 dynamic · axial · 3.0mm · 1.25mm/px · 1 of 160 slices shown (6 of 20)]
[im 1/160]
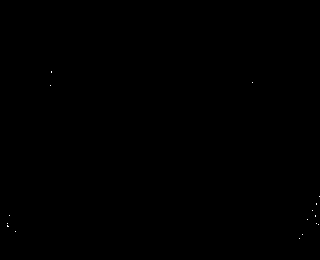

[Series 17: T1 dynamic · axial · 3.0mm · 1.25mm/px · 1 of 160 slices shown (7 of 20)]
[im 1/160]
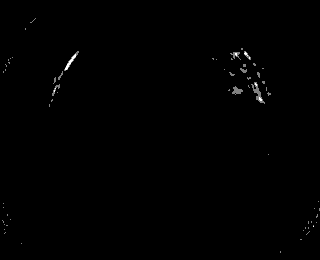

[Series 18: T1 dynamic · axial · 3.0mm · 1.25mm/px · 1 of 155 slices shown (8 of 20)]
[im 1/155]
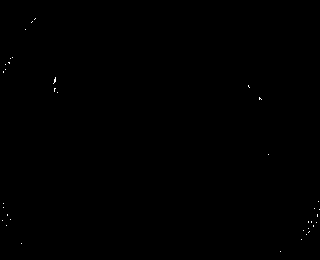

[Series 19: T1 dynamic · axial · 3.0mm · 1.25mm/px · 1 of 160 slices shown (9 of 20)]
[im 1/160]
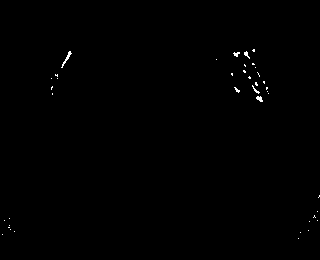

[Series 20: T1 dynamic · axial · 3.0mm · 1.25mm/px · 1 of 160 slices shown (10 of 20)]
[im 1/160]
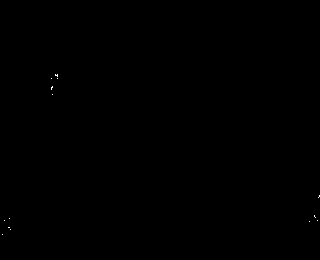

[Series 21: T1 dynamic · axial · 3.0mm · 1.25mm/px · 1 of 160 slices shown (11 of 20)]
[im 1/160]
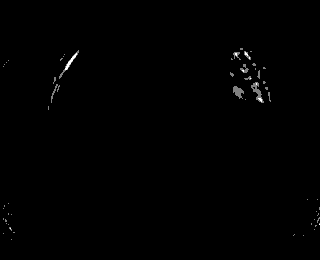

[Series 22: T1 dynamic · axial · 3.0mm · 1.25mm/px · 1 of 160 slices shown (12 of 20)]
[im 1/160]
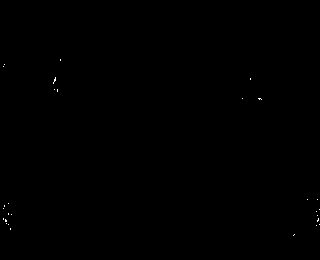

[Series 23: T1 dynamic · axial · 3.0mm · 1.25mm/px · 1 of 160 slices shown (13 of 20)]
[im 1/160]
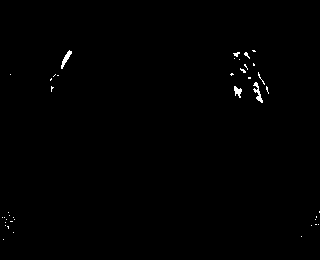

[Series 24: T1 dynamic · axial · 3.0mm · 1.25mm/px · 1 of 160 slices shown (14 of 20)]
[im 1/160]
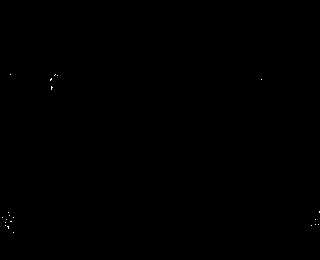

[Series 25: T1 dynamic · coronal · 3.0mm · 1.48mm/px · 1 of 80 slices shown (15 of 20)]
[im 1/80]
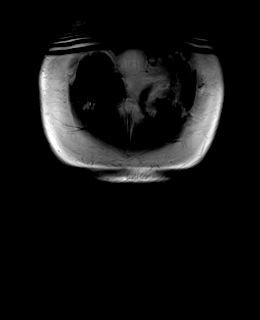

[Series 26: T1 dynamic · coronal · 3.0mm · 1.48mm/px · 1 of 80 slices shown (16 of 20)]
[im 1/80]
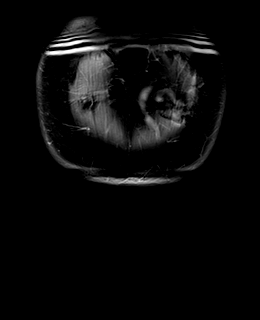

[Series 27: T1 dynamic · axial · 3.0mm · 1.25mm/px · 1 of 160 slices shown (17 of 20)]
[im 1/160]
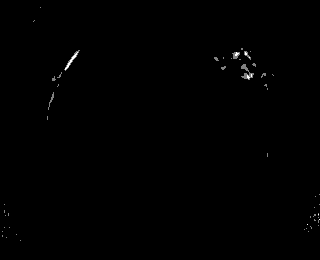

[Series 28: T1 dynamic · axial · 3.0mm · 1.25mm/px · 1 of 149 slices shown (18 of 20)]
[im 1/149]
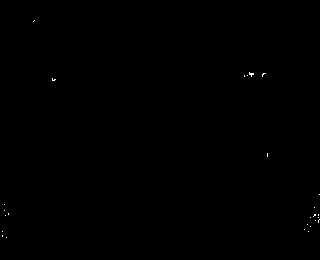

[Series 29: T1 dynamic · axial · 3.0mm · 1.25mm/px · 1 of 160 slices shown (19 of 20)]
[im 1/160]
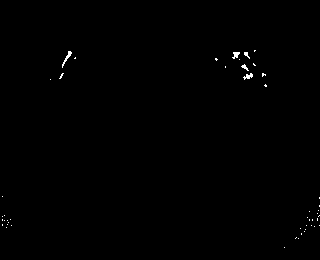

[Series 30: T1 dynamic · axial · 3.0mm · 1.25mm/px · 1 of 160 slices shown (20 of 20)]
[im 1/160]
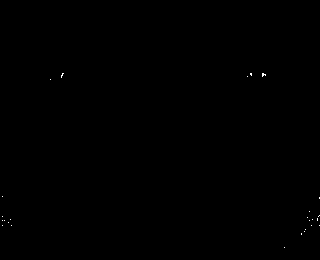

[Series 31: cor cine true-fisp · coronal · 10.0mm · 0.74mm/px · 1 of 100 slices shown (1 of 8)]
[im 1/100]
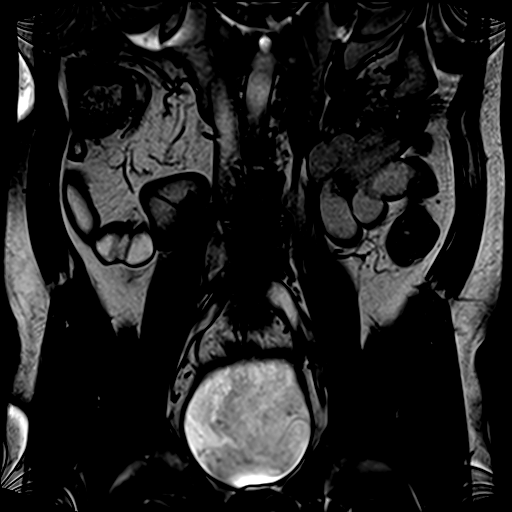

[Series 32: cor cine true-fisp · coronal · 10.0mm · 0.74mm/px · 1 of 100 slices shown (2 of 8)]
[im 1/100]
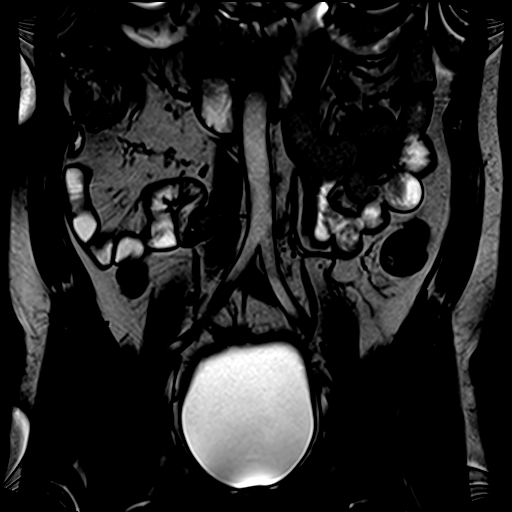

[Series 33: cor cine true-fisp · coronal · 10.0mm · 0.74mm/px · 1 of 100 slices shown (3 of 8)]
[im 1/100]
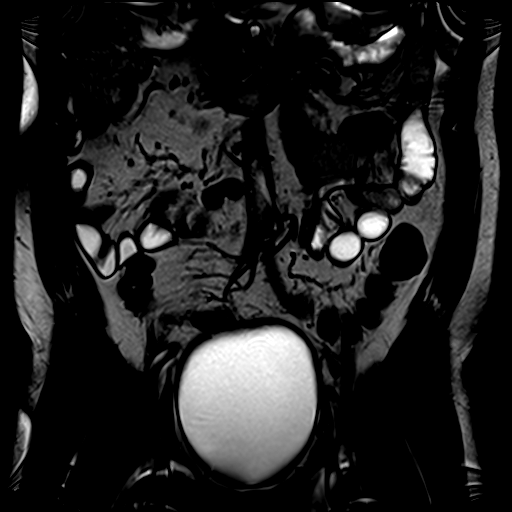

[Series 34: cor cine true-fisp · coronal · 10.0mm · 0.74mm/px · 1 of 100 slices shown (4 of 8)]
[im 1/100]
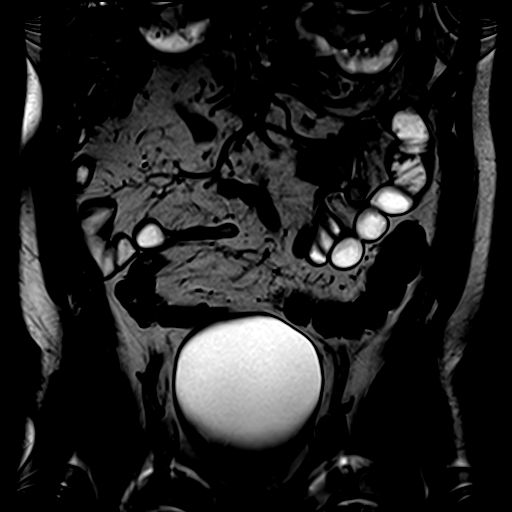

[Series 35: cor cine true-fisp · coronal · 10.0mm · 0.74mm/px · 1 of 100 slices shown (5 of 8)]
[im 1/100]
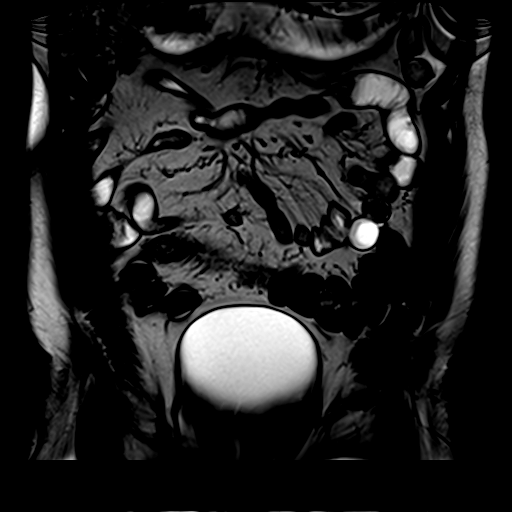

[Series 36: cor cine true-fisp · coronal · 10.0mm · 0.74mm/px · 1 of 100 slices shown (6 of 8)]
[im 1/100]
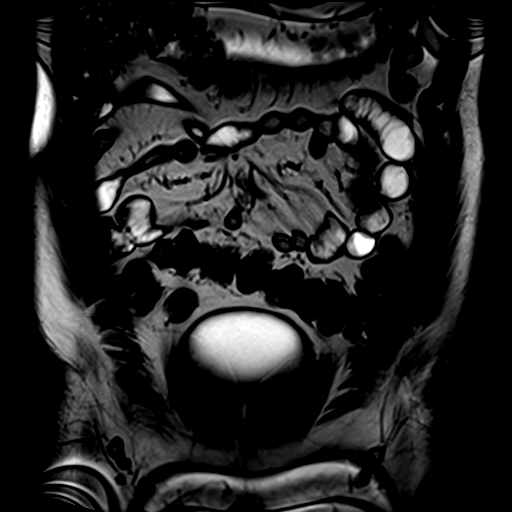

[Series 37: cor cine true-fisp · coronal · 10.0mm · 0.74mm/px · 1 of 100 slices shown (7 of 8)]
[im 1/100]
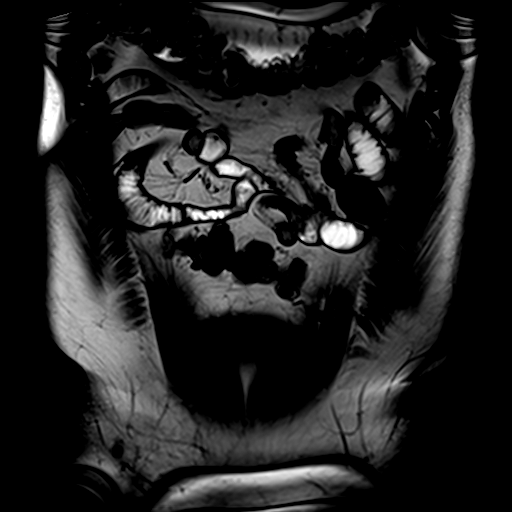

[Series 38: cor cine true-fisp · coronal · 10.0mm · 0.74mm/px · 1 of 100 slices shown (8 of 8)]
[im 1/100]
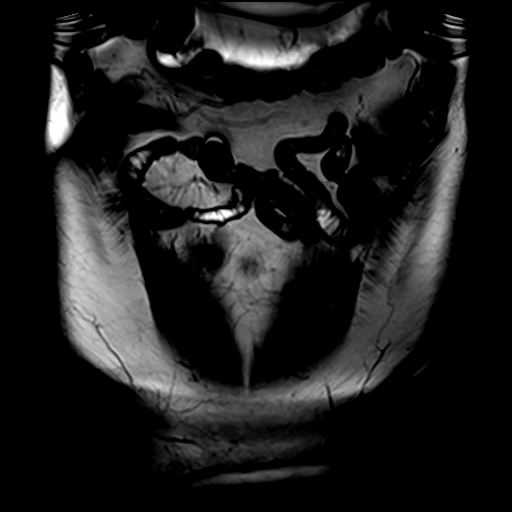

[35 of 49 positions shown; findings below may reference images not displayed]

FINDINGS: Urinary Tract:  No abnormality visualized.

Bowel: Redemonstrated complex, multi component right-sided anal
fistula. Arising from the right aspect of the sphincters,
approximately 8 o'clock face in lithotomy position, there is a
fistula with two rim enhancing subcutaneous fluid collections and
probable skin communications at the right aspect of the gluteal
cleft, both of which are substantially increased in size. The
superior component measures approximately 3.4 x 1.1 cm, previously
2.3 x 0.7 cm (series 46, image 43), and the inferior component
measures 2.6 x 1.1 cm, previously 1.7 x 1.0 cm (series 46, image
48). Fistula tract is approximately 8 cm in length to the most
inferior site of probable skin communication. At the left aspect of
the gluteal cleft, there is a more subtle associated subcutaneous
collection, similar to prior examination, measuring 1.4 x 0.5 cm
(series 46, image 47), and significantly lesser adjacent fat
stranding, without clear fistulous communication to the anus at this
time. Unremarkable visualized intrapelvic pelvic bowel loops.

Vascular/Lymphatic: No pathologically enlarged lymph nodes. No
significant vascular abnormality seen.

Reproductive:  No mass or other significant abnormality

Other:  None.

Musculoskeletal: No suspicious bone lesions identified.
IMPRESSION: 1. Redemonstrated complex, multi component right-sided anal fistula
arising at approximately 8 o'clock face in lithotomy position and
with two subcutaneous abscesses at the right aspect of the gluteal
cleft. Abscess cavities are increased in size compared to prior.
Fistula tract is approximately 8 cm in length to the most inferior
site of probable skin communication. Extensive associated
subcutaneous fat stranding.
2. Smaller subcutaneous fluid collection at the left aspect of the
gluteal fold is similar to prior examination, without clear
fistulous communication to the anus at this time. This may be
cavitary residual of a previously patent anal fistula.

## 2022-03-31 MED ORDER — GADOBUTROL 1 MMOL/ML IV SOLN
10.0000 mL | Freq: Once | INTRAVENOUS | Status: AC | PRN
  Administered 2022-03-31: 10 mL via INTRAVENOUS

## 2022-04-05 ENCOUNTER — Telehealth (INDEPENDENT_AMBULATORY_CARE_PROVIDER_SITE_OTHER): Payer: Self-pay | Admitting: *Deleted

## 2022-04-05 NOTE — Telephone Encounter (Signed)
Aaron Phillips - Per dr Laural Golden need to cancel appt on 20th with Dr. Jenetta Downer and reschedule with Dr. Laural Golden for 2 - 3 weeks after having infusion. ( Infusion is on 04/13/22). Patient is aware we are canceling appt on 20th and he will be called with new appt date.  ?

## 2022-04-12 ENCOUNTER — Ambulatory Visit (INDEPENDENT_AMBULATORY_CARE_PROVIDER_SITE_OTHER): Payer: 59 | Admitting: Gastroenterology

## 2022-04-12 ENCOUNTER — Ambulatory Visit (INDEPENDENT_AMBULATORY_CARE_PROVIDER_SITE_OTHER): Payer: 59 | Admitting: General Surgery

## 2022-04-12 ENCOUNTER — Encounter: Payer: Self-pay | Admitting: General Surgery

## 2022-04-12 ENCOUNTER — Other Ambulatory Visit: Payer: Self-pay

## 2022-04-12 VITALS — BP 106/73 | HR 71 | Temp 98.0°F | Resp 16 | Ht 74.0 in | Wt 226.0 lb

## 2022-04-12 DIAGNOSIS — K50913 Crohn's disease, unspecified, with fistula: Secondary | ICD-10-CM

## 2022-04-12 NOTE — Progress Notes (Signed)
[facility] ? ?Patient doing well. Starting skyrizi soon. He just saw Dr. Morton Stall Colorectal today that wants to see him back in 3 months. Dr. Morton Stall also looked at the setons and everything looks good. ? ?Dr.Waters is taking over care. Patient can call us with issues. ? ?BP 106/73   Pulse 71   Temp 98 ?F (36.7 ?C) (Oral)   Resp 16   Ht 6' 2"  (1.88 m)   Wt 226 lb (102.5 kg)   SpO2 97%   BMI 29.02 kg/m?  ? ?No charge for visit.  ? ?Curlene Labrum, MD ?[facility] ?[place], [place] [address] ?[phone] (office) ? ?

## 2022-04-18 ENCOUNTER — Encounter: Payer: 59 | Admitting: General Surgery

## 2022-04-24 ENCOUNTER — Ambulatory Visit (INDEPENDENT_AMBULATORY_CARE_PROVIDER_SITE_OTHER): Payer: 59 | Admitting: Internal Medicine

## 2022-04-24 ENCOUNTER — Encounter (INDEPENDENT_AMBULATORY_CARE_PROVIDER_SITE_OTHER): Payer: Self-pay | Admitting: Internal Medicine

## 2022-04-24 VITALS — BP 120/84 | HR 63 | Temp 97.7°F | Ht 74.0 in | Wt 225.5 lb

## 2022-04-24 DIAGNOSIS — K50913 Crohn's disease, unspecified, with fistula: Secondary | ICD-10-CM

## 2022-04-24 MED ORDER — VARENICLINE TARTRATE 1 MG PO TABS: 1.0000 mg | ORAL_TABLET | Freq: Every day | ORAL | 1 refills | Status: DC

## 2022-04-24 MED ORDER — VARENICLINE TARTRATE 1 MG PO TABS: 1.0000 mg | ORAL_TABLET | Freq: Two times a day (BID) | ORAL | 1 refills | Status: DC

## 2022-04-24 MED ORDER — VALACYCLOVIR HCL 500 MG PO TABS: 500.0000 mg | ORAL_TABLET | Freq: Every day | ORAL | 1 refills | Status: AC | PRN

## 2022-04-24 NOTE — Progress Notes (Signed)
Presenting complaint; ? ?Follow-up for fistulizing Crohn disease. ? ?Database and subjective: ? ?Patient is 51 year old Caucasian male who is here for scheduled visit.  He was last seen on 03/22/2022. ?He has bouts 30-year history of small and large bowel Crohn's disease maintained on oral mesalamine.  Colonoscopy in May 2021 documented endoscopic remission. ?Evaluation in office on 01/09/2022 revealed extensive changes of cellulitis involving right gluteal region and rectal examination suggested fistulae and perianal abscess.  He was immediately hospitalized for IV antibiotics and MR pelvis confirmed an abscess and complex fistulous disease.  He underwent EUA by Dr. Blake Divine with drainage of an abscess and placement of 2 setons.  He was maintained on Augmentin for several weeks.  Follow-up MR pelvis on 02/12/2022 revealed decrease in size of an abscess and cellulitis but not complete resolution.  Follow-up study on 03/31/2022 revealed resolution of abscess and dramatic improvement in cellulitis. ?Hepatitis B surface antigen and QuantiFERON TB Gold plus were negative. ?We recommended risankizumab for fistulous disease.  He was finally able to receive first dose 600 mg IV infusion on 04/13/2022.  He is scheduled for next dose on May 19 and third dose 4 weeks later.  He will then start maintenance dose of 360 mg subcu at week 12 and thereafter every 8 weeks. ?He has not had any side effects with interleukin antibody other than a fever blister which came after first dose and has resolved. ?He feels much better.  He has noted dramatic decrease in drainage after he received first dose.  Now he has very little drainage.  He is using tissues which she has to change to 3 times a day.  He denies anorectal or abdominal pain.  He is able to sit on a flat surface without any discomfort.  He is having 1 formed stool daily which is mostly solid.  He may have loose stool occasionally.  He denies melena or rectal bleeding nausea  vomiting fever or chills.  He has lost another 6 pounds since his last visit.  His weight loss is voluntary.  He decided to lose weight when he found his A1c was elevated. ?He states he is using pantoprazole on as-needed basis. ? ?He does not have appointment to see Dr. Riley Kill.  He states he needs to stay on Chantix little bit longer than just 1 month. ? ?He was seen by Dr. Cheryll Cockayne at [facility] on 04/12/2022.  He recommended to come back after third dose and hopefully can remove the setons.  Dr. Morton Stall also wanted him to see Dr. Vance Peper of GI division. ? ?Current Medications: ?Outpatient Encounter Medications as of 04/24/2022  ?Medication Sig  ? dicyclomine (BENTYL) 10 MG capsule Take 1 capsule (10 mg total) by mouth 3 (three) times daily as needed for spasms.  ? Melatonin 3 MG CAPS Take 3 mg by mouth at bedtime as needed (sleep).  ? Mesalamine 800 MG TBEC Take 1 tablet (800 mg total) by mouth in the morning and at bedtime.  ? pantoprazole (PROTONIX) 40 MG tablet TAKE 1 TABLET(40 MG) BY MOUTH DAILY  ? valACYclovir (VALTREX) 500 MG tablet Take 500 mg by mouth daily as needed (fever blisters).  ? Varenicline Tartrate (CHANTIX CONTINUING MONTH PAK PO) Take by mouth.  ? [DISCONTINUED] acetaminophen (TYLENOL) 325 MG tablet Take 2 tablets (650 mg total) by mouth every 6 (six) hours as needed for mild pain, fever or headache.  ? ?No facility-administered encounter medications on file as of 04/24/2022.  ? ? ? ?Objective: ?Blood  pressure 120/84, pulse 63, temperature 97.7 ?F (36.5 ?C), temperature source Oral, height 6' 2"  (1.88 m), weight 225 lb 8 oz (102.3 kg). ?Patient is alert and in no acute distress. ?Conjunctiva is pink. Sclera is nonicteric ?Oropharyngeal mucosa is normal. ?No neck masses or thyromegaly noted. ?Cardiac exam with regular rhythm normal S1 and S2. No murmur or gallop noted. ?Lungs are clear to auscultation. ?Abdomen is symmetrical soft and nontender with organomegaly or masses. ?Changes of cellulitis  involving right gluteal region have resolved.  2 setons are in place.  There is scant mucus drainage.  There is localized induration to the right of the anal opening. ?No LE edema or clubbing noted. ? ?Labs/studies Results: ? ? ? ?  Latest Ref Rng & Units 01/30/2022  ?  3:02 PM 01/11/2022  ?  6:06 AM 01/10/2022  ?  6:59 AM  ?CBC  ?WBC 3.8 - 10.8 Thousand/uL 14.1   14.4   13.2    ?Hemoglobin 13.2 - 17.1 g/dL 13.6   12.6   13.3    ?Hematocrit 38.5 - 50.0 % 39.5   37.5   39.6    ?Platelets 140 - 400 Thousand/uL 429   355   383    ?  ? ?  Latest Ref Rng & Units 01/11/2022  ?  6:06 AM 01/10/2022  ?  6:59 AM 01/09/2022  ?  4:20 PM  ?CMP  ?Glucose 70 - 99 mg/dL 115   110   102    ?BUN 6 - 20 mg/dL 9   12   15     ?Creatinine 0.61 - 1.24 mg/dL 0.84   0.77   0.83    ?Sodium 135 - 145 mmol/L 136   137   134    ?Potassium 3.5 - 5.1 mmol/L 4.1   4.1   3.7    ?Chloride 98 - 111 mmol/L 103   105   102    ?CO2 22 - 32 mmol/L 26   23   22     ?Calcium 8.9 - 10.3 mg/dL 8.5   8.7   8.9    ?Total Protein 6.5 - 8.1 g/dL   8.0    ?Total Bilirubin 0.3 - 1.2 mg/dL   0.8    ?Alkaline Phos 38 - 126 U/L   79    ?AST 15 - 41 U/L   15    ?ALT 0 - 44 U/L   14    ?  ? ?  Latest Ref Rng & Units 01/09/2022  ?  4:20 PM 09/29/2020  ?  9:40 AM  ?Hepatic Function  ?Total Protein 6.5 - 8.1 g/dL 8.0   7.7    ?Albumin 3.5 - 5.0 g/dL 3.7     ?AST 15 - 41 U/L 15   18    ?ALT 0 - 44 U/L 14   17    ?Alk Phosphatase 38 - 126 U/L 79     ?Total Bilirubin 0.3 - 1.2 mg/dL 0.8   0.6    ?  ?Lab Results  ?Component Value Date  ? CRP 22.0 (H) 01/30/2022  ?  ? ? ?Assessment: ? ?#1.  Crohn's disease.  He has small and large bowel Crohn disease with indolent course doing well on oral mesalamine but in January this year he developed fistulizing perianal disease with extensive cellulitis involving gluteal region.  He unfortunately had not been taking his mesalamine since he was doing so well.  He required I&D of perirectal abscess with seton placement.  He finally was able to start  interleukin-23 antibody about 2 weeks ago it appears to be working.  Patient remains on mesalamine.  This could be discontinued when he is in remission.  We will also wait to hear from Dr. Vance Peper. ? ?#2.  GERD.  Since he has lost weight and watching his diet he is not requiring PPI often.  He will continue to use it as needed. ? ?#3.  Cigarette smoking.  He feels he needs to continue Chantix another month or 2.  Therefore prescription given. ? ? ?Plan: ? ?Patient will call with progress report in 6 weeks. ?Chantix 1 mg p.o. twice daily 60 with 1 refill. ?Consultation with Dr. Renee Harder of [facility] as recommended by Dr. Cheryll Cockayne. ?Office visit with Dr. Jenetta Downer in 6 months. ? ? ? ?  ?

## 2022-04-24 NOTE — Patient Instructions (Signed)
Please call office with progress report in 2nd week June,2023 ?

## 2022-05-16 ENCOUNTER — Other Ambulatory Visit (INDEPENDENT_AMBULATORY_CARE_PROVIDER_SITE_OTHER): Payer: Self-pay | Admitting: *Deleted

## 2022-06-11 ENCOUNTER — Telehealth (INDEPENDENT_AMBULATORY_CARE_PROVIDER_SITE_OTHER): Payer: Self-pay | Admitting: Gastroenterology

## 2022-06-11 NOTE — Telephone Encounter (Signed)
Pt said he was returning a missed call. [phone]

## 2022-06-11 NOTE — Telephone Encounter (Signed)
Talked with patient and let him know that I submitted request for skyrizi infections to [facility] since he finished the infusions last Friday.

## 2022-06-14 ENCOUNTER — Other Ambulatory Visit (INDEPENDENT_AMBULATORY_CARE_PROVIDER_SITE_OTHER): Payer: Self-pay | Admitting: *Deleted

## 2022-06-14 MED ORDER — SKYRIZI 360 MG/2.4ML ~~LOC~~ SOCT: SUBCUTANEOUS | 5 refills | Status: DC

## 2022-06-20 ENCOUNTER — Other Ambulatory Visit: Payer: Self-pay | Admitting: *Deleted

## 2022-06-20 MED ORDER — SKYRIZI 360 MG/2.4ML ~~LOC~~ SOCT: SUBCUTANEOUS | 5 refills | Status: DC

## 2022-06-27 ENCOUNTER — Telehealth: Payer: Self-pay | Admitting: *Deleted

## 2022-06-27 NOTE — Telephone Encounter (Signed)
Fax from [facility] skyrizi approved from 06/15/22 - 06/16/23. Fax states to send Rx to [facility] and I sent in on 06/20/22. I called today and spoke with [facility] - pharmacist on 7/5 to check on status and was told it would be shipped to patient on 7/8. I called patient and he states he will be leaving to go out of town from 7-8 - 7/14. I called back to and spoke with representative Adonis Huguenin to see if date could be changed and she told me she would call patient to get date changed. I called and let patient know Adonis Huguenin from [facility] would be reaching out to him. While I was talking with him he said [facility] was calling on the other line. I also gave patient the pharmacy [facility] phone number to call if needed and told him he need to get in touch with skyrizi nurse ambassador who could come out to his home and show him how to do first injection. I let him know the med was 28,000 per matt at pharmacy and med would need to be refrigerated. He verbalized understanding of all.

## 2022-08-01 ENCOUNTER — Telehealth: Payer: Self-pay | Admitting: *Deleted

## 2022-08-01 NOTE — Telephone Encounter (Signed)
Patient called because pharmacy called him to tell him skyrizi would be sent to him.  He states his next dose is not til sept 15th or 16th. He was worried about leaving it in his fridge for that long. I let him know to tell pharm not to send that early but they already shipped it and he will get it Friday. He wanted to make sure he was suppose to take every 8 weeks and not every 4 weeks since pharmacy sending out early. I let him know the directions are every 8 weeks. He has had 3 infusions and one injection at home. He asked if he could do every month because although he is doing better he thought the med would be working better than it is.  His next appt is in November.

## 2022-08-01 NOTE — Telephone Encounter (Signed)
Thanks for the update. He should be getting the medication every 8 weeks. So far, there is very limited data regarding the use of this medication at shorter intervals for Crohn's disease, so would discourage from doing the medication every month.

## 2022-08-02 NOTE — Telephone Encounter (Signed)
Called and discussed with patient per Dr. Jenetta Downer -  He should be getting the medication every 8 weeks. So far, there is very limited data regarding the use of this medication at shorter intervals for Crohn's disease, so would discourage from doing the medication every month.  Patient verbalized understanding.

## 2022-08-27 ENCOUNTER — Other Ambulatory Visit (INDEPENDENT_AMBULATORY_CARE_PROVIDER_SITE_OTHER): Payer: Self-pay | Admitting: Internal Medicine

## 2022-08-28 NOTE — Telephone Encounter (Signed)
Last OV 04/24/22 with Dr. Laural Golden. Next appt 10/25/22

## 2022-10-25 ENCOUNTER — Ambulatory Visit (INDEPENDENT_AMBULATORY_CARE_PROVIDER_SITE_OTHER): Payer: 59 | Admitting: Gastroenterology

## 2022-11-06 ENCOUNTER — Encounter (INDEPENDENT_AMBULATORY_CARE_PROVIDER_SITE_OTHER): Payer: Self-pay | Admitting: Gastroenterology

## 2022-11-08 ENCOUNTER — Ambulatory Visit (INDEPENDENT_AMBULATORY_CARE_PROVIDER_SITE_OTHER): Payer: 59 | Admitting: Gastroenterology

## 2022-11-08 ENCOUNTER — Encounter (INDEPENDENT_AMBULATORY_CARE_PROVIDER_SITE_OTHER): Payer: Self-pay | Admitting: *Deleted

## 2022-11-08 ENCOUNTER — Telehealth (INDEPENDENT_AMBULATORY_CARE_PROVIDER_SITE_OTHER): Payer: Self-pay | Admitting: *Deleted

## 2022-11-08 ENCOUNTER — Encounter (INDEPENDENT_AMBULATORY_CARE_PROVIDER_SITE_OTHER): Payer: Self-pay | Admitting: Gastroenterology

## 2022-11-08 VITALS — BP 123/83 | HR 84 | Temp 97.5°F | Ht 74.0 in | Wt 235.9 lb

## 2022-11-08 DIAGNOSIS — K50913 Crohn's disease, unspecified, with fistula: Secondary | ICD-10-CM

## 2022-11-08 DIAGNOSIS — K50819 Crohn's disease of both small and large intestine with unspecified complications: Secondary | ICD-10-CM | POA: Diagnosis not present

## 2022-11-08 DIAGNOSIS — K603 Anal fistula: Secondary | ICD-10-CM

## 2022-11-08 MED ORDER — DICYCLOMINE HCL 10 MG PO CAPS: 10.0000 mg | ORAL_CAPSULE | ORAL | 1 refills | Status: DC | PRN

## 2022-11-08 MED ORDER — MESALAMINE 800 MG PO TBEC: 1.0000 | DELAYED_RELEASE_TABLET | Freq: Two times a day (BID) | ORAL | 1 refills | Status: DC

## 2022-11-08 NOTE — Progress Notes (Addendum)
Aaron Phillips, M.D. Gastroenterology & Hepatology [facility] [address] [place], [place] [address]  Primary Care Physician: [facility], [place] [address]  I will communicate my assessment and recommendations to the referring MD via EMR.  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 male with PMH ileocolonic Crohn's disease diagnosed at 48 year c/b perianal fistula and fistulizing disease in the colon, who presents for follow up of Crohn's disease.  The patient was last seen on 04/24/2022 by Dr. Laural Golden. At that time, the patient was advised to stop cigarette and he was advised to follow with [facility] for management of perianal fistulizing disease.  Was also advised to continue on Skyrizi for management of fistulizing disease.  Patient reports that he was started on Skyrizi in May 2023. He reports that he has received 3 doses of the SQ Stelara so far, last dose was 3-4 weeks ago. He is taking mesalamine 800 mg BID as well, which he reports actually makes him feel better - he has less bowel movements with it and is less tired. States he is still having diarrhea and feels that the medication has not made a big difference in his gastrointestinal symptoms- has diarrhea 1-2 times a day most days but some days has up to 5 bowel movements per day. Stools can be watery or loose. Denies fecal soiling but has urgency. He may take Bentyl to slow down his Bms if he is going out of home. Denies any hematochezia. Occasionally has abdominal cramping and passing gas but no severe pain, espeically when he does not eat healthy food. He also has had some nausea without vomiting.  Regarding his perianal disease, the patient had endoscopic ultrasound of cellulitis in early 2023 after which she underwent an exam under  anesthesia with Dr. Constance Haw and underwent drainage of abscess and placement of 2 setons.  He has followed at [facility] since then - Patient has been seen by Dr. Morton Stall at [facility] for perianal Crohn's disease. As his disease has been better controlled with Skyrizi, no further management was advised as his drainage in the perianal area has resolved based on the most recent note from 09/06/2022 - had his setons removed at that time.  States that after he started the Skyrizi his perianal disease significantly improved.  The patient denies having any fever, chills, hematochezia, melena, hematemesis, jaundice, pruritus. Has slowly gained weight.  Previous medications for IBD: Asacol  He is smoking one pack a day.  Last time he was on prednisone was when he was in his early 27s.  Most recent blood work-up from January 2023 showed WBC of 14.2, platelets of 325, hemoglobin 16.3, CMP with AST 19, ALT 20, total bilirubin 0.8, alkaline phosphatase 79, normal electrolytes, creatinine 0.78.  Most recent MRE 03/31/2022 IMPRESSION: 1. Unchanged, long segment wall thickening and narrowing of the terminal ileum, involving a segment approximately 25 cm in length from the ileocecal valve. Mild mucosal hyperenhancement without overt inflammatory fat stranding. Findings are compatible with Crohn's disease, without evidence of overt stricture or complicating obstruction, fistula, or abscess. 2. Dedicated anal fistula protocol images of the pelvis were not acquired; due to field of view limitations and field inhomogeneity artifact at the inferior extent of the study provided for review, the patient's known anal fistula is not meaningfully assessed. Patient will be requested to return for the  appropriate images and this exam will be addended with evaluation of the pelvis.   Last Colonoscopy: 04/28/2020 The terminal ileum contained a few scattered non-bleeding erosions. Biopsies were taken with a  cold forceps for histology. The pathology specimen was placed into Bottle Number 1. An area of mildly congested mucosa was found in the sigmoid colon, in the descending colon, at the splenic flexure, in the transverse colon, at the hepatic flexure and in the ascending colon. This was biopsied with a cold forceps for histology.  A few diverticula were found in the splenic flexure. External and internal hemorrhoids were found during retroflexion. The hemorrhoids were small.  FINAL MICROSCOPIC DIAGNOSIS:  A. TERMINAL ILEUM, BIOPSY: - Minimally active colitis. - There is no evidence of dysplasia or malignancy. - See comment.  B. COLON, RIGHT, BIOPSY: - Essentially unremarkable colorectal type mucosa. - There is no evidence of significant inflammation, dysplasia, or malignancy.  C. COLON, HEPATIC FLEXURE,TRANSVERSE, BIOPSY: - Essentially unremarkable colorectal type mucosa. - There is no evidence of significant inflammation, dysplasia, or malignancy.  D. COLON, LEFT, BIOPSY: - Essentially unremarkable colorectal type mucosa. - There is no evidence of significant inflammation, dysplasia, or malignancy.  E. COLON, SIGMOID, BIOPSY: - Essentially unremarkable colorectal type mucosa. - There is no evidence of significant inflammation, dysplasia, or malignancy.  F. RECTUM, BIOPSY: - Essentially unremarkable colorectal type mucosa. - There is no evidence of significant inflammation, dysplasia, or malignancy.   Past Medical History: Past Medical History:  Diagnosis Date   Anxiety    Crohn's disease ([place])    As a child he was dx.   Crohn's disease [facility])     Past Surgical History: Past Surgical History:  Procedure Laterality Date   BIOPSY  04/28/2020   Procedure: BIOPSY;  Surgeon: Rogene Houston, MD;  Location: [facility];  Service: Endoscopy;;   COLONOSCOPY N/A 12/09/2015   Procedure: COLONOSCOPY;  Surgeon: Rogene Houston, MD;  Location: [facility];  Service:  Endoscopy;  Laterality: N/A;  230   COLONOSCOPY N/A 04/28/2020   Procedure: COLONOSCOPY;  Surgeon: Rogene Houston, MD;  Location: [facility];  Service: Endoscopy;  Laterality: N/A;  200   INCISION AND DRAINAGE PERIRECTAL ABSCESS N/A 01/10/2022   Procedure: INCISION  AND DRAINAGE PERIRECTAL ABSCESS; seton placement x2;  Surgeon: Virl Cagey, MD;  Location: [facility] ORS;  Service: General;  Laterality: N/A;   INGUINAL HERNIA REPAIR  2017    Family History: Family History  Problem Relation Age of Onset   Hypertension Mother    Hypertension Father    Yves Dill Parkinson White syndrome Sister     Social History: Social History   Tobacco Use  Smoking Status Every Day   Packs/day: 1.00   Types: Cigarettes   Passive exposure: Current  Smokeless Tobacco Former   Types: Chew  Tobacco Comments   Patient states that he uses dip occasional   Social History   Substance and Sexual Activity  Alcohol Use Yes   Comment: occas   Social History   Substance and Sexual Activity  Drug Use No    Allergies: No Known Allergies  Medications: Current Outpatient Medications  Medication Sig Dispense Refill   dicyclomine (BENTYL) 10 MG capsule Take 10 mg by mouth as needed for spasms.     Melatonin 3 MG CAPS Take 3 mg by mouth at bedtime as needed (sleep).     Mesalamine 800 MG TBEC TAKE 1 TABLET BY MOUTH IN THE MORNING AND 1 TABLET AT  BEDTIME. 180 tablet 1   pantoprazole (PROTONIX) 40 MG tablet TAKE 1 TABLET(40 MG) BY MOUTH DAILY (Patient taking differently: 40 mg daily as needed. TAKE 1 TABLET(40 MG) BY MOUTH DAILY) 90 tablet 3   Risankizumab-rzaa (SKYRIZI) 360 MG/2.4ML SOCT Inject 2.4 ml every 8 weeks 2.4 mL 5   valACYclovir (VALTREX) 500 MG tablet Take 1 tablet (500 mg total) by mouth daily as needed (fever blisters). 30 tablet 1   varenicline (CHANTIX CONTINUING MONTH PAK) 1 MG tablet Take 1 tablet (1 mg total) by mouth 2 (two) times daily. (Patient not taking: Reported on 11/08/2022) 60  tablet 1   No current facility-administered medications for this visit.    Review of Systems: GENERAL: negative for malaise, night sweats HEENT: No changes in hearing or vision, no nose bleeds or other nasal problems. NECK: Negative for lumps, goiter, pain and significant neck swelling RESPIRATORY: Negative for cough, wheezing CARDIOVASCULAR: Negative for chest pain, leg swelling, palpitations, orthopnea GI: SEE HPI MUSCULOSKELETAL: Negative for joint pain or swelling, back pain, and muscle pain. SKIN: Negative for lesions, rash PSYCH: Negative for sleep disturbance, mood disorder and recent psychosocial stressors. HEMATOLOGY Negative for prolonged bleeding, bruising easily, and swollen nodes. ENDOCRINE: Negative for cold or heat intolerance, polyuria, polydipsia and goiter. NEURO: negative for tremor, gait imbalance, syncope and seizures. The remainder of the review of systems is noncontributory.   Physical Exam: BP 123/83 (BP Location: Left Arm, Patient Position: Sitting, Cuff Size: Large)   Pulse 84   Temp (!) 97.5 F (36.4 C) (Temporal)   Ht 6' 2"  (1.88 m)   Wt 235 lb 14.4 oz (107 kg)   BMI 30.29 kg/m  GENERAL: The patient is AO x3, in no acute distress. HEENT: Head is normocephalic and atraumatic. EOMI are intact. Mouth is well hydrated and without lesions. NECK: Supple. No masses LUNGS: Clear to auscultation. No presence of rhonchi/wheezing/rales. Adequate chest expansion HEART: RRR, normal s1 and s2. ABDOMEN: Soft, nontender, no guarding, no peritoneal signs, and nondistended. BS +. No masses. EXTREMITIES: Without any cyanosis, clubbing, rash, lesions or edema. NEUROLOGIC: AOx3, no focal motor deficit. SKIN: no jaundice, no rashes  Imaging/Labs: as above  I personally reviewed and interpreted the available labs, imaging and endoscopic files.  Impression and Plan: RUTHERFORD ALARIE is a 51 y.o. male with PMH ileocolonic Crohn's disease diagnosed at 85 year c/b  perianal fistula and fistulizing disease in the colon, who presents for follow up of Crohn's disease. The patient reports presenting significant improvement in his perianal disease with [name] but unfortunately has not achieved clinical remission. It is unclear if this is related to a mechanistic failure or due to the fact he is still smoking and has an aggressive IBD phenotype. I emphasized the importance of quitting cigarette consumption which he understood.  For now, we will we will noninvasively monitor his disease with fecal calprotectin, CBC and CRP, as well as repeating an MR enterography to determine inflammation extension at the moment.  Even though the combination of 5-ASA with biologicals has not shown to be statistically significant in clinical trials, he has presented some improvement with this medication so he can continue it for now.  He can also keep taking Bentyl as needed for abdominal pain and to avoid fecal urgency.  Ultimately, given the involvement of the colon in the past and longstanding disease, he will need to have a colonoscopy once he has achieved improvement of his inflammation.  We will wait until the results of the  MRI are back to determine the best timing for his colonoscopy.  - It is IMPERATIVE to stop smoking, patient will discuss with PCP way to achieve this - Check CBC, CRP and fecal calprotectin - Continue Skyrizi every 8 weeks - Continue mesalamine 800 mg BID - Continue Bentyl as needed - Schedule MR enterography - Depending on MR results, we will proceed with colonoscopy for CRC screening  All questions were answered.      Aaron Peppers, MD Gastroenterology and Hepatology [facility]

## 2022-11-08 NOTE — Telephone Encounter (Signed)
Per [facility] website no PA is required for MRI's scheduled

## 2022-11-08 NOTE — Patient Instructions (Addendum)
It is IMPERATIVE to stop smoking, please discuss with PCP way to achieve this Check CBC, CRP and fecal calprotectin Continue Skyrizi every 8 weeks Continue mesalamine 800 mg BID Continue Bentyl as needed Schedule MR enterography Depending on MR results, we will proceed with colonoscopy for CRC screening

## 2022-11-16 LAB — CBC WITH DIFFERENTIAL/PLATELET
Absolute Monocytes: 912 cells/uL (ref 200–950)
Basophils Absolute: 120 cells/uL (ref 0–200)
Basophils Relative: 0.7 %
Eosinophils Absolute: 378 cells/uL (ref 15–500)
Eosinophils Relative: 2.2 %
HCT: 44.3 % (ref 38.5–50.0)
Hemoglobin: 15.8 g/dL (ref 13.2–17.1)
Lymphs Abs: 3423 cells/uL (ref 850–3900)
MCH: 32.2 pg (ref 27.0–33.0)
MCHC: 35.7 g/dL (ref 32.0–36.0)
MCV: 90.2 fL (ref 80.0–100.0)
MPV: 10.6 fL (ref 7.5–12.5)
Monocytes Relative: 5.3 %
Neutro Abs: 12367 cells/uL — ABNORMAL HIGH (ref 1500–7800)
Neutrophils Relative %: 71.9 %
Platelets: 327 10*3/uL (ref 140–400)
RBC: 4.91 10*6/uL (ref 4.20–5.80)
RDW: 12.7 % (ref 11.0–15.0)
Total Lymphocyte: 19.9 %
WBC: 17.2 10*3/uL — ABNORMAL HIGH (ref 3.8–10.8)

## 2022-11-16 LAB — C-REACTIVE PROTEIN: CRP: 4.1 mg/L (ref <8.0)

## 2022-11-16 LAB — CALPROTECTIN: Calprotectin: 36 mcg/g

## 2022-11-30 ENCOUNTER — Ambulatory Visit (HOSPITAL_COMMUNITY)
Admission: RE | Admit: 2022-11-30 | Discharge: 2022-11-30 | Disposition: A | Discharge status: Home / Self Care | Payer: 59 | Source: Ambulatory Visit | Attending: Gastroenterology | Admitting: Gastroenterology

## 2022-11-30 DIAGNOSIS — K50819 Crohn's disease of both small and large intestine with unspecified complications: Secondary | ICD-10-CM

## 2022-11-30 DIAGNOSIS — K50913 Crohn's disease, unspecified, with fistula: Secondary | ICD-10-CM

## 2022-11-30 MED ORDER — GADOBUTROL 1 MMOL/ML IV SOLN
10.0000 mL | Freq: Once | INTRAVENOUS | Status: AC | PRN
  Administered 2022-11-30: 10 mL via INTRAVENOUS

## 2022-11-30 MED ORDER — GLUCAGON HCL RDNA (DIAGNOSTIC) 1 MG IJ SOLR
INTRAMUSCULAR | Status: AC
  Administered 2022-11-30: 1 mg via INTRAVASCULAR
  Filled 2022-11-30: qty 1

## 2022-12-03 ENCOUNTER — Other Ambulatory Visit: Payer: Self-pay | Admitting: Gastroenterology

## 2022-12-03 DIAGNOSIS — K50113 Crohn's disease of large intestine with fistula: Secondary | ICD-10-CM

## 2022-12-26 ENCOUNTER — Inpatient Hospital Stay: Payer: 59 | Attending: Hematology | Admitting: Hematology

## 2022-12-26 ENCOUNTER — Inpatient Hospital Stay: Payer: 59

## 2022-12-26 VITALS — BP 130/86 | HR 67 | Temp 97.9°F | Resp 18 | Ht 74.02 in | Wt 232.6 lb

## 2022-12-26 DIAGNOSIS — D729 Disorder of white blood cells, unspecified: Secondary | ICD-10-CM | POA: Diagnosis present

## 2022-12-26 DIAGNOSIS — K5090 Crohn's disease, unspecified, without complications: Secondary | ICD-10-CM | POA: Insufficient documentation

## 2022-12-26 DIAGNOSIS — E559 Vitamin D deficiency, unspecified: Secondary | ICD-10-CM | POA: Diagnosis not present

## 2022-12-26 DIAGNOSIS — E785 Hyperlipidemia, unspecified: Secondary | ICD-10-CM | POA: Diagnosis not present

## 2022-12-26 DIAGNOSIS — F17210 Nicotine dependence, cigarettes, uncomplicated: Secondary | ICD-10-CM | POA: Diagnosis not present

## 2022-12-26 DIAGNOSIS — Z87891 Personal history of nicotine dependence: Secondary | ICD-10-CM

## 2022-12-26 LAB — CBC WITH DIFFERENTIAL/PLATELET
Abs Immature Granulocytes: 0.04 10*3/uL (ref 0.00–0.07)
Basophils Absolute: 0.1 10*3/uL (ref 0.0–0.1)
Basophils Relative: 1 %
Eosinophils Absolute: 0.3 10*3/uL (ref 0.0–0.5)
Eosinophils Relative: 2 %
HCT: 45.1 % (ref 39.0–52.0)
Hemoglobin: 15.4 g/dL (ref 13.0–17.0)
Immature Granulocytes: 0 %
Lymphocytes Relative: 21 %
Lymphs Abs: 2.7 10*3/uL (ref 0.7–4.0)
MCH: 31.7 pg (ref 26.0–34.0)
MCHC: 34.1 g/dL (ref 30.0–36.0)
MCV: 92.8 fL (ref 80.0–100.0)
Monocytes Absolute: 0.6 10*3/uL (ref 0.1–1.0)
Monocytes Relative: 5 %
Neutro Abs: 9.3 10*3/uL — ABNORMAL HIGH (ref 1.7–7.7)
Neutrophils Relative %: 71 %
Platelets: 311 10*3/uL (ref 150–400)
RBC: 4.86 MIL/uL (ref 4.22–5.81)
RDW: 13 % (ref 11.5–15.5)
WBC: 13 10*3/uL — ABNORMAL HIGH (ref 4.0–10.5)
nRBC: 0 % (ref 0.0–0.2)

## 2022-12-26 LAB — C-REACTIVE PROTEIN: CRP: 0.7 mg/dL (ref <1.0)

## 2022-12-26 LAB — SEDIMENTATION RATE: Sed Rate: 10 mm/hr (ref 0–16)

## 2022-12-26 LAB — LACTATE DEHYDROGENASE: LDH: 122 U/L (ref 98–192)

## 2022-12-26 NOTE — Progress Notes (Signed)
[facility] [address] [place] [address]   CLINIC:  Medical Oncology/Hematology  CONSULT NOTE  Patient Care Team: [employer], MD as PCP - General (Internal Medicine)  CHIEF COMPLAINTS/PURPOSE OF CONSULTATION:  Leukocytosis  HISTORY OF PRESENTING ILLNESS:  Aaron Phillips 52 y.o. male is here at the request of Dr. Jenetta Downer for evaluation of leukocytosis.    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  Labs show chronic elevation in WBC (primarily neutrophilic) since at least 8938, with labs from 11/09/2022 showing WBC 17.2 with ANC 12.4.  Per Dr. Jenetta Downer, unclear if this is related only to his chronic disease, or is indicative of more concerning primary blood abnormality.  His CRP was normal, indicating that his bowel inflammation may be improving.  MR enterography of abdomen/pelvis (11/30/2022) showed mild active inflammatory small bowel Crohn's disease involving the terminal ileum and distal 20 cm of ileum improved since MRI in April 2023; study unable to evaluate perianal region.  He does have history of perianal fistula and abscess requiring hospitalization for incision/drainage and IV antibiotics in January 2023.  Patient reports that he was diagnosed with Crohn's disease in 2009.  He reports knowledge of elevated white blood cells since 2013, with baseline WBC reported around 12.0-14.0.  He is an active smoker (1 PPD since age 37).  He is not on any current steroid medications.  Denies any recent infections.  No fevers, chills, night sweats, unintentional weight loss.  He denies any new masses or lymphadenopathy.  He does report some mild fatigue for the past year, reports that he has been referred for sleep apnea testing by his PCP.  He has intermittent nausea and diarrhea related to his Crohn's disease. No shortness of breath, cough, chest pain,  vomiting, abdominal pain.  Denies any signs or symptoms of blood loss.  No current signs or symptoms of blood clots. Patient reports appetite at 100% and energy level at 75%. He is maintaining stable weight at this time.  Past medical history significant for Crohn's disease, vitamin D deficiency, hyperlipidemia.  Patient is employed as a Airline pilot for [employer].  He lives at home with his wife.  He has been smoking 1 PPD cigarettes x 37 years (since age 8).  He has occasional social alcohol.  No illicit drug use.  Patient denies any family history of leukemia or blood problems.  Reports that his paternal grandmother had unspecified cancer.   MEDICAL HISTORY:  Past Medical History:  Diagnosis Date   Anxiety    Crohn's disease ([place])    As a child he was dx.   Crohn's disease [facility])     SURGICAL HISTORY: Past Surgical History:  Procedure Laterality Date   BIOPSY  04/28/2020   Procedure: BIOPSY;  Surgeon: Rogene Houston, MD;  Location: [facility];  Service: Endoscopy;;   COLONOSCOPY N/A 12/09/2015   Procedure: COLONOSCOPY;  Surgeon: Rogene Houston, MD;  Location: [facility];  Service: Endoscopy;  Laterality: N/A;  230   COLONOSCOPY N/A 04/28/2020   Procedure: COLONOSCOPY;  Surgeon: Rogene Houston, MD;  Location: [facility];  Service: Endoscopy;  Laterality: N/A;  200   INCISION AND DRAINAGE PERIRECTAL ABSCESS N/A 01/10/2022   Procedure: INCISION  AND DRAINAGE PERIRECTAL ABSCESS; seton placement x2;  Surgeon: Virl Cagey, MD;  Location: [facility] ORS;  Service: General;  Laterality:  N/A;   INGUINAL HERNIA REPAIR  2017    SOCIAL HISTORY: Social History   Socioeconomic History   Marital status: Divorced    Spouse name: Not on file   Number of children: 1   Years of education: Not on file   Highest education level: Not on file  Occupational History   Not on file  Tobacco Use   Smoking status: Every Day    Packs/day: 1.00    Types: Cigarettes    Passive  exposure: Current   Smokeless tobacco: Former    Types: Chew   Tobacco comments:    Patient states that he uses dip occasional  Substance and Sexual Activity   Alcohol use: Yes    Comment: occas   Drug use: No   Sexual activity: Not on file  Other Topics Concern   Not on file  Social History Narrative   Not on file   Social Determinants of Health   Financial Resource Strain: Not on file  Food Insecurity: No Food Insecurity (12/26/2022)   Hunger Vital Sign    Worried About Running Out of Food in the Last Year: Never true    Ran Out of Food in the Last Year: Never true  Transportation Needs: No Transportation Needs (12/26/2022)   PRAPARE - Hydrologist (Medical): No    Lack of Transportation (Non-Medical): No  Physical Activity: Not on file  Stress: Not on file  Social Connections: Not on file  Intimate Partner Violence: Not At Risk (12/26/2022)   Humiliation, Afraid, Rape, and Kick questionnaire    Fear of Current or Ex-Partner: No    Emotionally Abused: No    Physically Abused: No    Sexually Abused: No    FAMILY HISTORY: Family History  Problem Relation Age of Onset   Hypertension Mother    Hypertension Father    Yves Dill Parkinson White syndrome Sister     ALLERGIES:  has No Known Allergies.  MEDICATIONS:  Current Outpatient Medications  Medication Sig Dispense Refill   cholecalciferol (VITAMIN D3) 25 MCG (1000 UNIT) tablet Take 1,000 Units by mouth daily. That 2 tablets daily     Mesalamine 800 MG TBEC Take 1 tablet (800 mg total) by mouth 2 (two) times daily. 180 tablet 1   Risankizumab-rzaa (SKYRIZI) 360 MG/2.4ML SOCT Inject 2.4 ml every 8 weeks 2.4 mL 5   dicyclomine (BENTYL) 10 MG capsule Take 1 capsule (10 mg total) by mouth as needed for spasms. (Patient not taking: Reported on 12/26/2022) 180 capsule 1   pantoprazole (PROTONIX) 40 MG tablet TAKE 1 TABLET(40 MG) BY MOUTH DAILY (Patient not taking: Reported on 12/26/2022) 90 tablet 3    valACYclovir (VALTREX) 500 MG tablet Take 1 tablet (500 mg total) by mouth daily as needed (fever blisters). (Patient not taking: Reported on 12/26/2022) 30 tablet 1   varenicline (CHANTIX CONTINUING MONTH PAK) 1 MG tablet Take 1 tablet (1 mg total) by mouth 2 (two) times daily. (Patient not taking: Reported on 12/26/2022) 60 tablet 1   No current facility-administered medications for this visit.    REVIEW OF SYSTEMS:   Review of Systems  Constitutional:  Positive for fatigue. Negative for appetite change, chills, diaphoresis, fever and unexpected weight change.  HENT:   Negative for lump/mass and nosebleeds.   Eyes:  Negative for eye problems.  Respiratory:  Negative for cough, hemoptysis and shortness of breath.   Cardiovascular:  Negative for chest pain, leg swelling and palpitations.  Gastrointestinal:  Positive for diarrhea and nausea. Negative for abdominal pain, blood in stool, constipation and vomiting.  Genitourinary:  Negative for hematuria.   Skin: Negative.   Neurological:  Negative for dizziness, headaches and light-headedness.  Hematological:  Does not bruise/bleed easily.     PHYSICAL EXAMINATION: ECOG PERFORMANCE STATUS: 0 - Asymptomatic  Vitals:   12/26/22 0906  BP: 130/86  Pulse: 67  Resp: 18  Temp: 97.9 F (36.6 C)  SpO2: 96%   Filed Weights   12/26/22 0906  Weight: 232 lb 9.4 oz (105.5 kg)    Physical Exam Constitutional:      Appearance: Normal appearance.  HENT:     Head: Normocephalic and atraumatic.     Mouth/Throat:     Mouth: Mucous membranes are moist.  Eyes:     Extraocular Movements: Extraocular movements intact.     Pupils: Pupils are equal, round, and reactive to light.  Cardiovascular:     Rate and Rhythm: Normal rate and regular rhythm.     Pulses: Normal pulses.     Heart sounds: Normal heart sounds.  Pulmonary:     Effort: Pulmonary effort is normal.     Breath sounds: Normal breath sounds.  Abdominal:     General: Bowel sounds are  normal.     Palpations: Abdomen is soft.     Tenderness: There is no abdominal tenderness.  Musculoskeletal:        General: No swelling.     Right lower leg: No edema.     Left lower leg: No edema.  Lymphadenopathy:     Cervical: No cervical adenopathy.  Skin:    General: Skin is warm and dry.  Neurological:     General: No focal deficit present.     Mental Status: He is alert and oriented to person, place, and time.  Psychiatric:        Mood and Affect: Mood normal.        Behavior: Behavior normal.       LABORATORY DATA:  I have reviewed the data as listed Recent Results (from the past 2160 hour(s))  CBC with Differential/Platelet     Status: Abnormal   Collection Time: 11/09/22 11:29 AM  Result Value Ref Range   WBC 17.2 (H) 3.8 - 10.8 Thousand/uL   RBC 4.91 4.20 - 5.80 Million/uL   Hemoglobin 15.8 13.2 - 17.1 g/dL   HCT 44.3 38.5 - 50.0 %   MCV 90.2 80.0 - 100.0 fL   MCH 32.2 27.0 - 33.0 pg   MCHC 35.7 32.0 - 36.0 g/dL   RDW 12.7 11.0 - 15.0 %   Platelets 327 140 - 400 Thousand/uL   MPV 10.6 7.5 - 12.5 fL   Neutro Abs 12,367 (H) 1,500 - 7,800 cells/uL   Lymphs Abs 3,423 850 - 3,900 cells/uL   Absolute Monocytes 912 200 - 950 cells/uL   Eosinophils Absolute 378 15 - 500 cells/uL   Basophils Absolute 120 0 - 200 cells/uL   Neutrophils Relative % 71.9 %   Total Lymphocyte 19.9 %   Monocytes Relative 5.3 %   Eosinophils Relative 2.2 %   Basophils Relative 0.7 %  C-reactive protein     Status: None   Collection Time: 11/09/22 11:29 AM  Result Value Ref Range   CRP 4.1 <8.0 mg/L  CALPROTECTIN     Status: None   Collection Time: 11/09/22 11:29 AM  Result Value Ref Range   Calprotectin 36 mcg/g    Comment:  Reference Range:                                       <50     Normal                                       50-120  Borderline                                       >120    Elevated . Calprotectin in Crohn's disease and  ulcerative colitis can be five to several thousand times above the reference population (50 mcg/g or less). Levels are usually 50 mcg/g or less in healthy patients and with irritable bowel syndrome. Repeat testing in 4-6 weeks is suggested for borderline values.   Lactate dehydrogenase     Status: None   Collection Time: 12/26/22  9:50 AM  Result Value Ref Range   LDH 122 98 - 192 U/L    Comment: Performed at [facility], [address], [place], [place] [address]  Sedimentation rate     Status: None   Collection Time: 12/26/22  9:50 AM  Result Value Ref Range   Sed Rate 10 0 - 16 mm/hr    Comment: Performed at [facility], [address], [place], [place] [address]  CBC with Differential/Platelet     Status: Abnormal   Collection Time: 12/26/22  9:50 AM  Result Value Ref Range   WBC 13.0 (H) 4.0 - 10.5 K/uL   RBC 4.86 4.22 - 5.81 MIL/uL   Hemoglobin 15.4 13.0 - 17.0 g/dL   HCT 45.1 39.0 - 52.0 %   MCV 92.8 80.0 - 100.0 fL   MCH 31.7 26.0 - 34.0 pg   MCHC 34.1 30.0 - 36.0 g/dL   RDW 13.0 11.5 - 15.5 %   Platelets 311 150 - 400 K/uL   nRBC 0.0 0.0 - 0.2 %   Neutrophils Relative % 71 %   Neutro Abs 9.3 (H) 1.7 - 7.7 K/uL   Lymphocytes Relative 21 %   Lymphs Abs 2.7 0.7 - 4.0 K/uL   Monocytes Relative 5 %   Monocytes Absolute 0.6 0.1 - 1.0 K/uL   Eosinophils Relative 2 %   Eosinophils Absolute 0.3 0.0 - 0.5 K/uL   Basophils Relative 1 %   Basophils Absolute 0.1 0.0 - 0.1 K/uL   Immature Granulocytes 0 %   Abs Immature Granulocytes 0.04 0.00 - 0.07 K/uL    Comment: Performed at [facility], [address] [place], [place] [address]  C-reactive protein     Status: None   Collection Time: 12/26/22  9:51 AM  Result Value Ref Range   CRP 0.7 <1.0 mg/dL    Comment: Performed at [facility], [address] [place],  [address]    RADIOGRAPHIC STUDIES: I have personally reviewed the radiological images as listed and agreed with the findings in the report. MR  ENTERO PELVIS W WO CONTRAST  Result Date: 12/03/2022 CLINICAL DATA:  Crohn disease of small and large intestines with complication. History of perianal fistula. EXAM: MR ABDOMEN AND PELVIS WITHOUT AND WITH CONTRAST (MR ENTEROGRAPHY) TECHNIQUE: Multiplanar, multisequence MRI of the abdomen and pelvis was performed both  before and during bolus administration of intravenous contrast. Negative oral contrast VoLumen was given. CONTRAST:  69m GADAVIST GADOBUTROL 1 MMOL/ML IV SOLN COMPARISON:  03/31/2022 MR enterography. FINDINGS: COMBINED FINDINGS FOR BOTH MR ABDOMEN AND PELVIS Lower chest: No acute abnormality at the lung bases. Hepatobiliary: Normal liver size and configuration. No liver mass. Normal gallbladder with no cholelithiasis. No biliary ductal dilatation. Common bile duct diameter 2 mm. No choledocholithiasis. No biliary masses, strictures or beading. Pancreas: No pancreatic mass or duct dilation.  No pancreas divisum. Spleen: Normal size spleen. Tiny 0.9 cm lymphangioma in anterior spleen, previously 0.7 cm, not substantially changed. No new splenic lesions. Adrenals/Urinary Tract: Normal adrenals. No hydronephrosis. Several benign right renal cysts, largest 2.5 cm in the lower right kidney. No suspicious renal masses. Normal bladder. Stomach/Bowel: Normal non-distended stomach. No dilated small bowel loops. There is mild wall thickening and mild mucosal hyperenhancement in the terminal ileum and distal approximately 20 cm of ileum with skip lesions, decreased since 03/31/2022 MRI. No new sites of small or large bowel wall thickening or mucosal hyperenhancement. No evidence of bowel fistula, mass or abscess. Normal appendix. This study is not tailored for evaluation of the perianal region. Vascular/Lymphatic: Normal caliber abdominal aorta. Patent portal, splenic, hepatic and renal veins. No pathologically enlarged lymph nodes in the abdomen or pelvis. Reproductive: Normal size prostate. Other: No ascites  or focal fluid collection. Musculoskeletal: No aggressive appearing focal osseous lesions. IMPRESSION: 1. Mild active inflammatory small bowel Crohn's disease involving the terminal ileum and distal 20 cm of ileum, improved since 03/31/2022 MRI. No evidence of bowel obstruction. No bowel fistula, mass or abscess. 2. No new sites of active inflammatory bowel disease. 3. This study is not tailored for evaluation of the perianal region. Electronically Signed   By: JIlona SorrelM.D.   On: 12/03/2022 09:41   MR ENTERO ABDOMEN W WO CONTRAST  Result Date: 12/03/2022 CLINICAL DATA:  History of Crohn's disease. Perianal fistula secondary to Crohn's. EXAM: MR ABDOMEN AND PELVIS WITHOUT AND WITH CONTRAST (MR ENTEROGRAPHY) TECHNIQUE: Multiplanar, multisequence MRI of the abdomen and pelvis was performed both before and during bolus administration of intravenous contrast. Negative oral contrast VoLumen was given. CONTRAST:  144mGADAVIST GADOBUTROL 1 MMOL/ML IV SOLN COMPARISON:  Previous MR enterography from April of 2023. FINDINGS: COMBINED FINDINGS FOR BOTH MR ABDOMEN AND PELVIS Lower chest: Lung bases are not well assessed on this abdominal and pelvic MRI assessment. Hepatobiliary: No focal, suspicious hepatic lesion. No pericholecystic stranding. No biliary duct dilation. Portal vein is patent. Pancreas:  Normal, without mass, inflammation or ductal dilatation. Spleen:  Normal. Adrenals/Urinary Tract: Adrenal glands are normal. 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 Urinary bladder with smooth contour. No perivesical stranding. Stomach/Bowel: No signs of bowel obstruction, fistula or abscess. Signs of enteritis, less pronounced than on previous imaging at the neoterminal ileum. Mural stratification also less pronounced than on the previous exam in the distal ileum. Length of involvement is unchanged. No  substantial surrounding stranding about the anastomotic site. Vascular/Lymphatic: No pathologically enlarged lymph nodes identified. No abdominal aortic aneurysm demonstrated. Reproductive: Not fully evaluated due to field of view constraints. Other: Perianal region is not evaluated on this standard MR enterography evaluation. Musculoskeletal: No suspicious bone lesions identified. IMPRESSION: 1. Improved appearance of sequela of enteritis in the distal and neoterminal ileum. No signs of fistula, abscess or obstruction. Limited enhancement compatible with changes  which are weighted more towards chronic sequela of Crohn's with improvement. 2. Perianal region is not evaluated on this standard MR enterography evaluation, if fistula evaluation was desired repeat imaging with fistula protocol could be performed as warranted. Field of view constraints do not allow for standard in MRI enterography and pelvic floor/perianal evaluation in most patients. These results will be called to the ordering clinician or representative by the Radiologist Assistant, and communication documented in the PACS or [employer]. Electronically Signed   By: Zetta Bills M.D.   On: 12/03/2022 09:10     ASSESSMENT & PLAN: 1.  Neutrophilic leukocytosis - Seen at the request of Dr. Jenetta Downer for evaluation of leukocytosis. 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 - Chronic elevation in WBCs since at least 2013 per patient report. - Labs show chronic elevation in WBC (primarily neutrophilic) since at least 1749, with labs from 11/09/2022 showing WBC 17.2 with ANC 12.4. - Recent MRI abdomen/pelvis (December 2023) showed improvement in his small bowel inflammation, and CRP has normalized.  Since WBC remains persistently elevated, Dr. Jenetta Downer was concerned if there could be some underlying primary blood abnormality. - Patient smokes 1 PPD cigarettes  since age 69. - No current steroid medications or recent infections. - No fevers, chills, night sweats, unintentional weight loss. - No palpable masses or lymphadenopathy on exam. - PLAN: Suspect reactive leukocytosis secondary to Crohn's disease and tobacco use.  Will evaluate for MPN by checking BCR/ABL FISH, JAK2 with reflex, inflammatory markers, and repeat CBC/D. - Phone visit in 2 weeks to discuss results and next steps.  2.  Tobacco use - Patient is current everyday smoker, 1 PPD x 37 years (since age 49) - We discussed annual low-dose CT chest for lung cancer screening for early detection of lung cancer in high risk patients, as well as statistically significant improvement in mortality.  Patient is agreeable. - PLAN: Schedule for LDCT chest.  3.  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 He has been smoking 1 PPD cigarettes x 37 years (since age 71). He has occasional social alcohol. No illicit drug use.  - FAMILY: Patient denies any family history of leukemia or blood problems.  Reports that his paternal grandmother had unspecified cancer.    PLAN SUMMARY: >> Labs TODAY (CBC/D, LDH, ESR, CRP, BCR/ABL FISH, JAK2 with reflex) >> LDCT chest for lung cancer screening >> PHONE visit in 2 weeks to discuss results   All questions were answered. The patient knows to call the clinic with any problems, questions or concerns.   Medical decision making: Moderate  Time spent on visit: I spent 35 minutes counseling the patient face to face. The total time spent in the appointment was 50 minutes and more than 50% was on counseling.  I, Tarri Abernethy PA-C, have seen this patient in conjunction with Dr. Derek Jack.  Greater than 50% of visit was performed by Dr. Delton Coombes.   Derek Jack, MD 12/26/2022 10:01 AM  DR. Karelly Dewalt: This  patient was evaluated by me independently and I formulated his assessment and plan.  I agree with the H&P and assessment and plan written by Casey Burkitt, PA-C.  Patient was evaluated for chronic leukocytosis, predominantly neutrophilic in the setting of Crohn's disease.  He is also current active smoker, 1  pack/day for the last 37 years.  Crohn's is fairly under control with Skyrizi and mesalamine.  No recurrent infections noted.  We discussed the differential diagnosis.  Will check for myeloproliferative neoplasms.  Will also schedule for low-dose lung cancer screening CT scan.

## 2022-12-26 NOTE — Patient Instructions (Signed)
[place] [facility] **   You were seen today by Dr. Delton Coombes & Tarri Abernethy PA-C for your elevated white blood cells.    ELEVATED WHITE BLOOD CELLS Your high white blood cells are most likely related to your Crohn's disease and cigarette smoking. However, we will check labs today to make sure you do not have any underlying blood abnormality or genetic mutation that is causing high white blood cells.  TOBACCO USE Please see attached handout on smoking cessation.  You can also look up [facility] classes for further smoking cessation assistance. We will schedule you for low-dose CT chest, which is an annual test to screen for lung cancer and high risk patients.  LABS: Labs TODAY before leaving the hospital building   FOLLOW-UP APPOINTMENT: Phone visit in 2 weeks to discuss results and next steps  ** Thank you for trusting me with your healthcare!  I strive to provide all of my patients with quality care at each visit.  If you receive a survey for this visit, I would be so grateful to you for taking the time to provide feedback.  Thank you in advance!  ~ [place]                   Dr. Derek Jack   &   Tarri Abernethy, PA-C   - - - - - - - - - - - - - - - - - -    Thank you for choosing [place] [facility] to provide your oncology and hematology care.  To afford each patient quality time with our provider, please arrive at least 15 minutes before your scheduled appointment time.   If you have a lab appointment with the [facility] please come in thru the Main Entrance and check in at the main information desk.  You need to re-schedule your appointment should you arrive 10 or more minutes late.  We strive to give you quality time with our providers, and arriving late affects you and other patients whose appointments are after yours.  Also, if you no show three or  more times for appointments you may be dismissed from the clinic at the providers discretion.     Again, thank you for choosing [facility].  Our hope is that these requests will decrease the amount of time that you wait before being seen by our physicians.       _____________________________________________________________  Should you have questions after your visit to [facility], please contact our office at [phone] and follow the prompts.  Our office hours are 8:00 a.m. and 4:30 p.m. Monday - Friday.  Please note that voicemails left after 4:00 p.m. may not be returned until the following business day.  We are closed weekends and major holidays.  You do have access to a nurse 24-7, just call the main number to the clinic [phone] and do not press any options, hold on the line and a nurse will answer the phone.    For prescription refill requests, have your pharmacy contact our office and allow 72 hours.

## 2022-12-29 LAB — BCR-ABL1 FISH
Cells Analyzed: 200
Cells Counted: 200

## 2023-01-03 LAB — CALR +MPL + E12-E15  (REFLEX)

## 2023-01-03 LAB — JAK2 V617F RFX CALR/MPL/E12-15

## 2023-01-14 NOTE — Progress Notes (Deleted)
VIRTUAL VISIT via [place]   I connected with Aaron Phillips  on *** at  *** by telephone and verified that I am speaking with the correct person using two identifiers.  Location: Patient: Home Provider: [facility]   I discussed the limitations, risks, security and privacy concerns of performing an evaluation and management service by telephone and the availability of in person appointments. I also discussed with the patient that there may be a patient responsible charge related to this service. The patient expressed understanding and agreed to proceed.  REASON FOR VISIT: Leukocytosis  PRIOR THERAPY: None  CURRENT THERAPY: Under workup  INTERVAL HISTORY:  Mr. Aaron Phillips is contacted today for follow-up of his leukocytosis.  He was seen for initial consultation by Dr. Delton Coombes and Tarri Abernethy PA-C on 12/26/2022.  At today's visit, he reports feeling ***.  He denies any changes in his health or symptoms since his visit 3 weeks ago.  No recent infections, fevers, chills, night sweats, weight loss, masses, lymphadenopathy.  He continues to report some mild fatigue for the past year, has been referred for sleep apnea testing by PCP.  He has intermittent nausea and diarrhea related to his Crohn's disease. No shortness of breath, cough, chest pain, vomiting, abdominal pain.  Denies any signs or symptoms of blood loss.  No current signs or symptoms of blood clots. Patient reports appetite at ***% and energy level at ***%. He is maintaining stable weight at this time.    REVIEW OF SYSTEMS: ***  ROS   PHYSICAL EXAM: (per limitations of virtual telephone visit)  The patient is alert and oriented x 3, exhibiting adequate mentation, good mood, and ability to speak in full sentences and execute sound judgement.***  ASSESSMENT & PLAN:  1.  Neutrophilic leukocytosis - Seen at the request of Dr. Jenetta Downer for evaluation of leukocytosis. 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 - Chronic elevation in WBCs since at least 2013 per patient report. - Labs show chronic elevation in WBC (primarily neutrophilic) since at least 0000000, with labs from 11/09/2022 showing WBC 17.2 with ANC 12.4. - Recent MRI abdomen/pelvis (December 2023) showed improvement in his small bowel inflammation, and CRP has normalized.  Since WBC remains persistently elevated, Dr. Jenetta Downer was concerned if there could be some underlying primary blood abnormality. - Patient smokes 1 PPD cigarettes since age 21. - No current steroid medications or recent infections. - No fevers, chills, night sweats, unintentional weight loss. - No palpable masses or lymphadenopathy on exam. - Hematology workup (12/26/2022):  BCR ABL FISH negative. Mutational MPN analysis negative for JAK2, CALR, MPL. LDH, CRP and ESR normal. - Most recent CBC (12/26/2022): WBC 13.0 with ANC 9.3, otherwise normal. - DIFFERENTIAL DIAGNOSIS: Clinical picture favors reactive leukocytosis secondary to Crohn's disease and tobacco use.  No evidence of MPN at this time. - PLAN: Continue monitoring with same-day CBC/D and LDH in 6 months.  Will consider discharge from clinic at that time if no new abnormalities or deviations from baseline.   2.  Tobacco use - Patient is current everyday smoker, 1 PPD x 37 years (since age 34) - We discussed annual low-dose CT chest for lung cancer screening for early detection of lung cancer in high risk patients, as well as statistically significant improvement in mortality.  Patient is agreeable. - PLAN: Patient is scheduled for LDCT chest  on 02/06/2023.   3.  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 He has been smoking 1 PPD cigarettes x 37 years (since age  48). He has occasional social alcohol. No illicit drug use.  - FAMILY: Patient denies any family history of leukemia or blood problems.  Reports that his paternal grandmother had unspecified cancer.   PLAN SUMMARY: >> Same-day labs (CBC/D, LDH) + OFFICE visit in 6 months     I discussed the assessment and treatment plan with the patient. The patient was provided an opportunity to ask questions and all were answered. The patient agreed with the plan and demonstrated an understanding of the instructions.   The patient was advised to call back or seek an in-person evaluation if the symptoms worsen or if the condition fails to improve as anticipated.  I provided *** minutes of non-face-to-face time during this encounter.   [name], PA-C ***

## 2023-01-15 ENCOUNTER — Inpatient Hospital Stay: Payer: 59 | Admitting: Physician Assistant

## 2023-01-15 ENCOUNTER — Encounter: Payer: Self-pay | Admitting: Physician Assistant

## 2023-01-15 ENCOUNTER — Inpatient Hospital Stay (HOSPITAL_BASED_OUTPATIENT_CLINIC_OR_DEPARTMENT_OTHER): Payer: 59 | Admitting: Physician Assistant

## 2023-01-15 DIAGNOSIS — D72829 Elevated white blood cell count, unspecified: Secondary | ICD-10-CM

## 2023-01-15 NOTE — Progress Notes (Signed)
VIRTUAL VISIT via [place]   I connected with Aaron Phillips  on 01/15/23 at  4:15 PM by telephone and verified that I am speaking with the correct person using two identifiers.  Location: Patient: Home Provider: [facility]   I discussed the limitations, risks, security and privacy concerns of performing an evaluation and management service by telephone and the availability of in person appointments. I also discussed with the patient that there may be a patient responsible charge related to this service. The patient expressed understanding and agreed to proceed.  REASON FOR VISIT: Leukocytosis   PRIOR THERAPY: None   CURRENT THERAPY: Under workup   INTERVAL HISTORY:  Aaron Phillips is contacted today for follow-up of his leukocytosis.  He was seen for initial consultation by Dr. Delton Coombes and Tarri Abernethy PA-C on 12/26/2022.  At today's visit, he reports feeling well.  He denies any changes in his health or symptoms since his visit 3 weeks ago.  No recent infections, fevers, chills, night sweats, weight loss, masses, lymphadenopathy.  He continues to report some mild fatigue for the past year, has been referred for sleep apnea testing by PCP.  He has intermittent nausea and diarrhea related to his Crohn's disease. No shortness of breath, cough, chest pain, vomiting, abdominal pain.  Denies any signs or symptoms of blood loss.  No current signs or symptoms of blood clots. Patient reports appetite at 75% and energy level at 100%. He is maintaining stable weight at this time.   REVIEW OF SYSTEMS:   Review of Systems  Constitutional:  Negative for chills, diaphoresis, fever, malaise/fatigue and weight loss.  Respiratory:  Negative for cough and shortness of breath.   Cardiovascular:  Negative for chest pain and palpitations.  Gastrointestinal:  Negative for abdominal pain, blood in stool, melena, nausea and vomiting.  Neurological:   Negative for dizziness and headaches.     PHYSICAL EXAM: (per limitations of virtual telephone visit)  The patient is alert and oriented x 3, exhibiting adequate mentation, good mood, and ability to speak in full sentences and execute sound judgement.  ASSESSMENT & PLAN:  1.  Neutrophilic leukocytosis - Seen at the request of Dr. Jenetta Downer for evaluation of leukocytosis. 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 - Chronic elevation in WBCs since at least 2013 per patient report. - Labs show chronic elevation in WBC (primarily neutrophilic) since at least 4128, with labs from 11/09/2022 showing WBC 17.2 with ANC 12.4. - Recent MRI abdomen/pelvis (December 2023) showed improvement in his small bowel inflammation, and CRP has normalized.  Since WBC remains persistently elevated, Dr. Jenetta Downer was concerned if there could be some underlying primary blood abnormality. - Patient smokes 1 PPD cigarettes since age 7. - No current steroid medications or recent infections. - No fevers, chills, night sweats, unintentional weight loss. - No palpable masses or lymphadenopathy on exam. - Hematology workup (12/26/2022):  BCR ABL FISH negative. Mutational MPN analysis negative for JAK2, CALR, MPL. LDH, CRP and ESR normal. - Most recent CBC (12/26/2022): WBC 13.0 with ANC 9.3, otherwise normal. - DIFFERENTIAL DIAGNOSIS: Clinical picture favors reactive leukocytosis secondary to Crohn's disease and tobacco use.  No evidence of MPN at this time. - PLAN: Continue monitoring with same-day CBC/D and LDH in 6 months.  Will consider discharge from clinic in 6-12 months if no new abnormalities or deviations from baseline.  2.  Tobacco use - Patient is current everyday smoker, 1 PPD x 37 years (since age 54) - We discussed annual low-dose CT chest for lung cancer screening for early detection of lung cancer in high risk patients, as  well as statistically significant improvement in mortality.  Patient is agreeable. - PLAN: Patient is scheduled for LDCT chest on 02/06/2023.   3.  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 He has been smoking 1 PPD cigarettes x 37 years (since age 76). He has occasional social alcohol. No illicit drug use.  - FAMILY: Patient denies any family history of leukemia or blood problems.  Reports that his paternal grandmother had unspecified cancer.    PLAN SUMMARY: >> Same-day labs (CBC/D, LDH) + OFFICE visit in 6 months     I discussed the assessment and treatment plan with the patient. The patient was provided an opportunity to ask questions and all were answered. The patient agreed with the plan and demonstrated an understanding of the instructions.   The patient was advised to call back or seek an in-person evaluation if the symptoms worsen or if the condition fails to improve as anticipated.  I provided 8 minutes of non-face-to-face time during this encounter.  [name], PA-C 01/15/23 4:22 PM

## 2023-01-16 ENCOUNTER — Other Ambulatory Visit: Payer: Self-pay

## 2023-01-16 DIAGNOSIS — Z87891 Personal history of nicotine dependence: Secondary | ICD-10-CM

## 2023-01-16 DIAGNOSIS — D729 Disorder of white blood cells, unspecified: Secondary | ICD-10-CM

## 2023-02-04 ENCOUNTER — Encounter (INDEPENDENT_AMBULATORY_CARE_PROVIDER_SITE_OTHER): Payer: Self-pay | Admitting: Gastroenterology

## 2023-02-04 ENCOUNTER — Encounter (INDEPENDENT_AMBULATORY_CARE_PROVIDER_SITE_OTHER): Payer: Self-pay | Admitting: *Deleted

## 2023-02-04 ENCOUNTER — Telehealth (INDEPENDENT_AMBULATORY_CARE_PROVIDER_SITE_OTHER): Payer: Self-pay | Admitting: *Deleted

## 2023-02-04 ENCOUNTER — Ambulatory Visit (INDEPENDENT_AMBULATORY_CARE_PROVIDER_SITE_OTHER): Payer: 59 | Admitting: Gastroenterology

## 2023-02-04 VITALS — BP 116/78 | HR 73 | Temp 97.5°F | Ht 74.0 in | Wt 233.6 lb

## 2023-02-04 DIAGNOSIS — Z1321 Encounter for screening for nutritional disorder: Secondary | ICD-10-CM

## 2023-02-04 DIAGNOSIS — K50819 Crohn's disease of both small and large intestine with unspecified complications: Secondary | ICD-10-CM | POA: Diagnosis not present

## 2023-02-04 MED ORDER — SUTAB 1479-225-188 MG PO TABS: ORAL_TABLET | ORAL | 0 refills | Status: DC

## 2023-02-04 NOTE — Telephone Encounter (Signed)
Notification or Prior Authorization is not required for the requested services This [employer] plan does not currently require a prior authorization for these services. If you have general questions about the prior authorization requirements, please call us at [phone] or visit [email or web address] > Clinician Resources > Advance and Admission Notification Requirements. The number above acknowledges your notification. Please write this number down for future reference. Notification is not a guarantee of coverage or payment. Decision ID #: [id number]

## 2023-02-04 NOTE — Patient Instructions (Addendum)
It is IMPERATIVE to stop smoking, please discuss with PCP way to achieve this Perform blood workup Continue Skyrizi every 8 weeks Stop mesalamine, if you notice your symptoms worsen after doing this please give [place] a call to refill this medication again Continue Bentyl as needed for abdominal pain Schedule colonoscopy Ask PCP about pneumonia and shingles vaccine.

## 2023-02-04 NOTE — Progress Notes (Signed)
Aaron Phillips, M.D. Gastroenterology & Hepatology [facility] [address] [place], [place] [address]  Primary Care Physician: [facility], [place] [phone]  I will communicate my assessment and recommendations to the referring MD via EMR.  Problems: Ileocolonic fistulizing Crohn's disease  Perianal fistulizing disease in remission   History of Present Illness: Aaron Phillips is a 52 y.o. male  with PMH ileocolonic Crohn's disease diagnosed at 65 years c/b perianal fistula and fistulizing disease in the colon, who presents for follow up of Crohn's disease.  who presents for follow up of Crohn's disease.  The patient was last seen on 11/08/2022. At that time, the patient was continued on Skyrizi every 8 weeks and mesalamine 800 mg twice daily.Marland Kitchen  An MRI was ordered with findings described below.  Patient was strongly advised to stop smoking.  States that he is feeling better compared to the past. He is having 1-3 bowel movements in average, usually between soft to formed, rarely watery BMs, nocturnal episodes or urgency. No blood or mucus in stool. The patient denies having any nausea, vomiting, fever, chills, hematochezia, melena, hematemesis, abdominal distention, abdominal pain, jaundice, pruritus. Has lost some weight on purpose by cutting down on carbs and processed foods.  He is taking Skyrizi every 8 weeks compliantly without any side effects.  Last dose January 22nd, 2024. Can notice a difference in symptoms control by the time he is reaching 8 weeks of Skyrizi last dose.  Still smoking a pack a day - has tried to work more on weight loss first, then he will try to stop smoking.  Patient is asking if he really needs to take mesalamine as he has to pay a fair amount of money for this.  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  Last seen in the clinic was on 12/26/2022  by Dr. Delton Coombes.  It was suspected that his neutrophilia was related to chronic disease and tobacco use.  Most recent blood workup on 12/26/2022 showed a WBC of 13,000, hemoglobin 15.4, platelets 311.  Notably, his most recent calprotectin on 11/09/2022 was normal at 36.  CRP was normal at 4.1.  Previous MRE 03/31/2022 IMPRESSION: 1. Unchanged, long segment wall thickening and narrowing of the terminal ileum, involving a segment approximately 25 cm in length from the ileocecal valve. Mild mucosal hyperenhancement without overt inflammatory fat stranding. Findings are compatible with Crohn's disease, without evidence of overt stricture or complicating obstruction, fistula, or abscess.  Most recent MRE from 11/30/2022 showed improved appearance of enteritis in the distal neoterminal ileum.  Again, evaluation of the pelvic area was limited as this was not evaluated in this study.   Last Colonoscopy: 04/28/2020 The terminal ileum contained a few scattered non-bleeding erosions. Biopsies were taken with a cold forceps for histology. The pathology specimen was placed into Bottle Number 1. An area of mildly congested mucosa was found in the sigmoid colon, in the descending colon, at the splenic flexure, in the transverse colon, at the hepatic flexure and in the ascending colon. This was biopsied with a cold forceps for histology.  A few diverticula were found in the splenic flexure. External and internal hemorrhoids were found during retroflexion. The hemorrhoids were small.   FINAL MICROSCOPIC DIAGNOSIS:  A. TERMINAL ILEUM, BIOPSY: - Minimally active colitis. - There is no evidence of dysplasia or malignancy. - See comment.  B. COLON, RIGHT, BIOPSY: - Essentially unremarkable colorectal type mucosa. -  There is no evidence of significant inflammation, dysplasia, or malignancy.  C. COLON, HEPATIC FLEXURE,TRANSVERSE, BIOPSY: - Essentially unremarkable colorectal type mucosa. - There is no  evidence of significant inflammation, dysplasia, or malignancy.  D. COLON, LEFT, BIOPSY: - Essentially unremarkable colorectal type mucosa. - There is no evidence of significant inflammation, dysplasia, or malignancy.  E. COLON, SIGMOID, BIOPSY: - Essentially unremarkable colorectal type mucosa. - There is no evidence of significant inflammation, dysplasia, or malignancy.  F. RECTUM, BIOPSY: - Essentially unremarkable colorectal type mucosa. - There is no evidence of significant inflammation, dysplasia, or malignancy.   Last flu shot:2022 but had the flu in 2023 Last pneumonia shot:never Last evaluation by dermatology: many years ago but wife looks for skin frequently Last zoster vaccine: never Last DEXA scan: never, not interested COVID-19 shot: Moderna x1  Past Medical History: Past Medical History:  Diagnosis Date   Anxiety    Crohn's disease ([place])    As a child he was dx.   Crohn's disease [facility])     Past Surgical History: Past Surgical History:  Procedure Laterality Date   BIOPSY  04/28/2020   Procedure: BIOPSY;  Surgeon: Rogene Houston, MD;  Location: [facility];  Service: Endoscopy;;   COLONOSCOPY N/A 12/09/2015   Procedure: COLONOSCOPY;  Surgeon: Rogene Houston, MD;  Location: [facility];  Service: Endoscopy;  Laterality: N/A;  230   COLONOSCOPY N/A 04/28/2020   Procedure: COLONOSCOPY;  Surgeon: Rogene Houston, MD;  Location: [facility];  Service: Endoscopy;  Laterality: N/A;  200   INCISION AND DRAINAGE PERIRECTAL ABSCESS N/A 01/10/2022   Procedure: INCISION  AND DRAINAGE PERIRECTAL ABSCESS; seton placement x2;  Surgeon: Virl Cagey, MD;  Location: [facility] ORS;  Service: General;  Laterality: N/A;   INGUINAL HERNIA REPAIR  2017    Family History: Family History  Problem Relation Age of Onset   Hypertension Mother    Hypertension Father    Yves Dill Parkinson White syndrome Sister     Social History: Social History   Tobacco Use  Smoking  Status Every Day   Packs/day: 1.00   Types: Cigarettes   Passive exposure: Current  Smokeless Tobacco Former   Types: Chew  Tobacco Comments   Patient states that he uses dip occasional   Social History   Substance and Sexual Activity  Alcohol Use Yes   Comment: occas   Social History   Substance and Sexual Activity  Drug Use No    Allergies: No Known Allergies  Medications: Current Outpatient Medications  Medication Sig Dispense Refill   cholecalciferol (VITAMIN D3) 25 MCG (1000 UNIT) tablet Take 1,000 Units by mouth daily. That 2 tablets daily     dicyclomine (BENTYL) 10 MG capsule Take 1 capsule (10 mg total) by mouth as needed for spasms. 180 capsule 1   Mesalamine 800 MG TBEC Take 1 tablet (800 mg total) by mouth 2 (two) times daily. 180 tablet 1   pantoprazole (PROTONIX) 40 MG tablet TAKE 1 TABLET(40 MG) BY MOUTH DAILY 90 tablet 3   Risankizumab-rzaa (SKYRIZI) 360 MG/2.4ML SOCT Inject 2.4 ml every 8 weeks 2.4 mL 5   valACYclovir (VALTREX) 500 MG tablet Take 1 tablet (500 mg total) by mouth daily as needed (fever blisters). 30 tablet 1   rosuvastatin (CRESTOR) 10 MG tablet Take 10 mg by mouth at bedtime. (Patient not taking: Reported on 01/15/2023)     No current facility-administered medications for this visit.    Review of Systems: GENERAL: negative  for malaise, night sweats HEENT: No changes in hearing or vision, no nose bleeds or other nasal problems. NECK: Negative for lumps, goiter, pain and significant neck swelling RESPIRATORY: Negative for cough, wheezing CARDIOVASCULAR: Negative for chest pain, leg swelling, palpitations, orthopnea GI: SEE HPI MUSCULOSKELETAL: Negative for joint pain or swelling, back pain, and muscle pain. SKIN: Negative for lesions, rash PSYCH: Negative for sleep disturbance, mood disorder and recent psychosocial stressors. HEMATOLOGY Negative for prolonged bleeding, bruising easily, and swollen nodes. ENDOCRINE: Negative for cold or  heat intolerance, polyuria, polydipsia and goiter. NEURO: negative for tremor, gait imbalance, syncope and seizures. The remainder of the review of systems is noncontributory.   Physical Exam: BP 116/78 (BP Location: Left Arm, Patient Position: Sitting, Cuff Size: Large)   Pulse 73   Temp (!) 97.5 F (36.4 C) (Temporal)   Ht 6' 2"$  (1.88 m)   Wt 233 lb 9.6 oz (106 kg)   BMI 29.99 kg/m  GENERAL: The patient is AO x3, in no acute distress. HEENT: Head is normocephalic and atraumatic. EOMI are intact. Mouth is well hydrated and without lesions. NECK: Supple. No masses LUNGS: Clear to auscultation. No presence of rhonchi/wheezing/rales. Adequate chest expansion HEART: RRR, normal s1 and s2. ABDOMEN: Soft, nontender, no guarding, no peritoneal signs, and nondistended. BS +. No masses. EXTREMITIES: Without any cyanosis, clubbing, rash, lesions or edema. NEUROLOGIC: AOx3, no focal motor deficit. SKIN: no jaundice, no rashes  Imaging/Labs: as above  I personally reviewed and interpreted the available labs, imaging and endoscopic files.  Impression and Plan: Aaron Phillips is a 52 y.o. male  with PMH ileocolonic Crohn's disease diagnosed at 30 years c/b perianal fistula and fistulizing disease in the colon, who presents for follow up of Crohn's disease.  who presents for follow up of Crohn's disease.  The patient has presented significant improvement of his Crohn's disease symptoms while using Skyrizi every 8 weeks.  In fact, this led to resolution of his perianal disease.  The patient is very happy with the clinical improvement he has presented.  Has tolerated medication adequately.  Again, I emphasized the importance of smoking cessation as this will lead to better long-term outcomes for his Crohn's disease.  As he is already on a biological and there is very little evidence of clinical improvement while on mesalamine on previous trials, I advised him to stop taking the medication but he  can restart taking it if he feels that his symptoms worsen after discontinuation.  We will proceed with repeat colonoscopy to determine if he has achieved endoscopic remission, this will also help [place] perform colorectal cancer screening as he has longstanding history of Crohn's ileocolitis.  Patient has received some vaccines in the past but is not up-to-date in terms of his preventive measures.  I advised him to obtain the pneumonia and shingles vaccination.  He is not interested in pursuing hematology pending at this moment but would like to hold off on it for now which I find reasonable.  - It is IMPERATIVE to stop smoking, please discuss with PCP way to achieve this -Check CBC, CMP, vitamin D, QuantiFERON and hepatitis B serology - Continue Skyrizi every 8 weeks -Stop mesalamine, if he notices symptoms worsen after doing this, will refill this medication again - Continue Bentyl as needed for abdominal pain - Schedule colonoscopy - Ask PCP about pneumonia and shingles vaccine.  All questions were answered.      Aaron Peppers, MD Gastroenterology and Hepatology [facility]

## 2023-02-06 ENCOUNTER — Ambulatory Visit (HOSPITAL_COMMUNITY)
Admission: RE | Admit: 2023-02-06 | Discharge: 2023-02-06 | Disposition: A | Discharge status: Home / Self Care | Payer: 59 | Source: Ambulatory Visit | Attending: Physician Assistant | Admitting: Physician Assistant

## 2023-02-06 DIAGNOSIS — Z87891 Personal history of nicotine dependence: Secondary | ICD-10-CM | POA: Insufficient documentation

## 2023-02-06 LAB — COMPREHENSIVE METABOLIC PANEL
AG Ratio: 1.3 (calc) (ref 1.0–2.5)
ALT: 15 U/L (ref 9–46)
AST: 14 U/L (ref 10–35)
Albumin: 3.9 g/dL (ref 3.6–5.1)
Alkaline phosphatase (APISO): 58 U/L (ref 35–144)
BUN: 14 mg/dL (ref 7–25)
CO2: 27 mmol/L (ref 20–32)
Calcium: 9.7 mg/dL (ref 8.6–10.3)
Chloride: 103 mmol/L (ref 98–110)
Creat: 1.08 mg/dL (ref 0.70–1.30)
Globulin: 2.9 g/dL (calc) (ref 1.9–3.7)
Glucose, Bld: 91 mg/dL (ref 65–99)
Potassium: 4.4 mmol/L (ref 3.5–5.3)
Sodium: 138 mmol/L (ref 135–146)
Total Bilirubin: 0.5 mg/dL (ref 0.2–1.2)
Total Protein: 6.8 g/dL (ref 6.1–8.1)

## 2023-02-06 LAB — CBC WITH DIFFERENTIAL/PLATELET
Absolute Monocytes: 525 cells/uL (ref 200–950)
Basophils Absolute: 109 cells/uL (ref 0–200)
Basophils Relative: 1.1 %
Eosinophils Absolute: 347 cells/uL (ref 15–500)
Eosinophils Relative: 3.5 %
HCT: 45.7 % (ref 38.5–50.0)
Hemoglobin: 15.6 g/dL (ref 13.2–17.1)
Lymphs Abs: 2534 cells/uL (ref 850–3900)
MCH: 31.7 pg (ref 27.0–33.0)
MCHC: 34.1 g/dL (ref 32.0–36.0)
MCV: 92.9 fL (ref 80.0–100.0)
MPV: 10 fL (ref 7.5–12.5)
Monocytes Relative: 5.3 %
Neutro Abs: 6386 cells/uL (ref 1500–7800)
Neutrophils Relative %: 64.5 %
Platelets: 305 10*3/uL (ref 140–400)
RBC: 4.92 10*6/uL (ref 4.20–5.80)
RDW: 12 % (ref 11.0–15.0)
Total Lymphocyte: 25.6 %
WBC: 9.9 10*3/uL (ref 3.8–10.8)

## 2023-02-06 LAB — VITAMIN D 25 HYDROXY (VIT D DEFICIENCY, FRACTURES): Vit D, 25-Hydroxy: 36 ng/mL (ref 30–100)

## 2023-02-06 LAB — LIPID PANEL
Cholesterol: 181 mg/dL (ref <200)
HDL: 52 mg/dL (ref >=40)
LDL Cholesterol (Calc): 110 mg/dL (calc) — ABNORMAL HIGH
Non-HDL Cholesterol (Calc): 129 mg/dL (calc) (ref <130)
Total CHOL/HDL Ratio: 3.5 (calc) (ref <5.0)
Triglycerides: 99 mg/dL (ref <150)

## 2023-02-06 LAB — HEPATITIS B SURFACE ANTIGEN: Hepatitis B Surface Ag: NONREACTIVE

## 2023-02-06 LAB — QUANTIFERON-TB GOLD PLUS
Mitogen-NIL: 8.13 IU/mL
NIL: 0.02 IU/mL
QuantiFERON-TB Gold Plus: NEGATIVE
TB1-NIL: 0.05 IU/mL
TB2-NIL: 0.02 IU/mL

## 2023-02-11 ENCOUNTER — Ambulatory Visit (INDEPENDENT_AMBULATORY_CARE_PROVIDER_SITE_OTHER): Payer: 59 | Admitting: Gastroenterology

## 2023-03-08 ENCOUNTER — Ambulatory Visit (HOSPITAL_BASED_OUTPATIENT_CLINIC_OR_DEPARTMENT_OTHER): Payer: 59 | Admitting: Anesthesiology

## 2023-03-08 ENCOUNTER — Ambulatory Visit (HOSPITAL_COMMUNITY)
Admission: RE | Admit: 2023-03-08 | Discharge: 2023-03-08 | Disposition: A | Discharge status: Home / Self Care | Payer: 59 | Attending: Gastroenterology | Admitting: Gastroenterology

## 2023-03-08 ENCOUNTER — Encounter (HOSPITAL_COMMUNITY): Payer: Self-pay | Admitting: Gastroenterology

## 2023-03-08 ENCOUNTER — Ambulatory Visit (HOSPITAL_COMMUNITY): Payer: 59 | Admitting: Anesthesiology

## 2023-03-08 ENCOUNTER — Encounter (HOSPITAL_COMMUNITY)
Admission: RE | Disposition: A | Discharge status: Home / Self Care | Payer: Self-pay | Source: Home / Self Care | Attending: Gastroenterology

## 2023-03-08 ENCOUNTER — Other Ambulatory Visit: Payer: Self-pay

## 2023-03-08 DIAGNOSIS — K6389 Other specified diseases of intestine: Secondary | ICD-10-CM | POA: Diagnosis not present

## 2023-03-08 DIAGNOSIS — K50813 Crohn's disease of both small and large intestine with fistula: Secondary | ICD-10-CM | POA: Insufficient documentation

## 2023-03-08 DIAGNOSIS — K603 Anal fistula: Secondary | ICD-10-CM | POA: Insufficient documentation

## 2023-03-08 DIAGNOSIS — K5080 Crohn's disease of both small and large intestine without complications: Secondary | ICD-10-CM

## 2023-03-08 DIAGNOSIS — F17210 Nicotine dependence, cigarettes, uncomplicated: Secondary | ICD-10-CM | POA: Diagnosis not present

## 2023-03-08 DIAGNOSIS — F17220 Nicotine dependence, chewing tobacco, uncomplicated: Secondary | ICD-10-CM | POA: Diagnosis not present

## 2023-03-08 DIAGNOSIS — K5730 Diverticulosis of large intestine without perforation or abscess without bleeding: Secondary | ICD-10-CM | POA: Diagnosis not present

## 2023-03-08 DIAGNOSIS — K219 Gastro-esophageal reflux disease without esophagitis: Secondary | ICD-10-CM | POA: Diagnosis not present

## 2023-03-08 DIAGNOSIS — Z1211 Encounter for screening for malignant neoplasm of colon: Secondary | ICD-10-CM | POA: Diagnosis not present

## 2023-03-08 HISTORY — PX: COLONOSCOPY WITH PROPOFOL: SHX5780

## 2023-03-08 HISTORY — PX: BIOPSY: SHX5522

## 2023-03-08 LAB — HM COLONOSCOPY

## 2023-03-08 SURGERY — COLONOSCOPY WITH PROPOFOL
Anesthesia: General

## 2023-03-08 MED ORDER — DEXMEDETOMIDINE HCL IN NACL 80 MCG/20ML IV SOLN
INTRAVENOUS | Status: DC | PRN
  Administered 2023-03-08 (×2): 8 ug via BUCCAL

## 2023-03-08 MED ORDER — LIDOCAINE HCL (CARDIAC) PF 100 MG/5ML IV SOSY
PREFILLED_SYRINGE | INTRAVENOUS | Status: DC | PRN
  Administered 2023-03-08: 50 mg via INTRAVENOUS

## 2023-03-08 MED ORDER — PROPOFOL 10 MG/ML IV BOLUS
INTRAVENOUS | Status: DC | PRN
  Administered 2023-03-08 (×2): 50 mg via INTRAVENOUS
  Administered 2023-03-08: 100 mg via INTRAVENOUS
  Administered 2023-03-08: 50 mg via INTRAVENOUS

## 2023-03-08 MED ORDER — LACTATED RINGERS IV SOLN: INTRAVENOUS | Status: DC | PRN

## 2023-03-08 MED ORDER — PROPOFOL 500 MG/50ML IV EMUL
INTRAVENOUS | Status: DC | PRN
  Administered 2023-03-08: 150 ug/kg/min via INTRAVENOUS

## 2023-03-08 MED ORDER — LACTATED RINGERS IV SOLN: INTRAVENOUS | Status: DC

## 2023-03-08 NOTE — Transfer of Care (Signed)
Immediate Anesthesia Transfer of Care Note  Patient: Aaron Phillips  Procedure(s) Performed: COLONOSCOPY WITH PROPOFOL BIOPSY  Patient Location: Endoscopy Unit  Anesthesia Type:General  Level of Consciousness: awake  Airway & Oxygen Therapy: Patient Spontanous Breathing  Post-op Assessment: Report given to RN and Post -op Vital signs reviewed and stable  Post vital signs: Reviewed and stable  Last Vitals:  Vitals Value Taken Time  BP    Temp    Pulse    Resp    SpO2      Last Pain:  Vitals:   03/08/23 1020  TempSrc: Oral  PainSc: 0-No pain      Patients Stated Pain Goal: 5 ([id number])  Complications: No notable events documented.

## 2023-03-08 NOTE — Op Note (Addendum)
[facility] Patient Name: Aaron Phillips Procedure Date: 03/08/2023 11:30 AM MRN: [id number] Date of Birth: [birth year] Attending MD: [name] , , [id number] CSN: [id number] Age: 52 Admit Type: Outpatient Procedure:                Colonoscopy Indications:              Crohn's disease of the small bowel and colon Providers:                [name], Lurline Del, RN, Kristine L. Risa Grill, Technician Referring MD:             [name] Medicines:                Monitored Anesthesia Care Complications:            No immediate complications. Estimated Blood Loss:     Estimated blood loss: none. Procedure:                Pre-Anesthesia Assessment:                           - Prior to the procedure, a History and Physical                            was performed, and patient medications, allergies                            and sensitivities were reviewed. The patient's                            tolerance of previous anesthesia was reviewed.                           - The risks and benefits of the procedure and the                            sedation options and risks were discussed with the                            patient. All questions were answered and informed                            consent was obtained.                           After obtaining informed consent, the colonoscope                            was passed under direct vision. Throughout the                            procedure, the patient's blood pressure, pulse, and                            oxygen  saturations were monitored continuously. The                            [id number] scope was introduced through                            the anus and advanced to the the cecum, identified                            by appendiceal orifice and ileocecal valve. The                            colonoscopy was performed without difficulty. The                             patient tolerated the procedure well. The quality                            of the bowel preparation was excellent. Scope In: 11:41:50 AM Scope Out: 12:05:52 PM Scope Withdrawal Time: 0 hours 19 minutes 16 seconds  Total Procedure Duration: 0 hours 24 minutes 2 seconds  Findings:      The perianal exam findings include scar with closed perianal fistula.      A diffuse area of the ileum, 10 cm from the ileocecal valve was mildy       congested. No ulcerations were found. Biopsies were taken with a cold       forceps for histology.      An area of mildly congested mucosa was found in the ascending colon.       Biopsies were taken with a cold forceps for histology.      Scattered large-mouthed and small-mouthed diverticula were found in the       sigmoid colon, descending colon and ascending colon.      The retroflexed view of the distal rectum and anal verge was normal and       showed no anal or rectal abnormalities.      Note: Marked improvement in inflammation, appears to be in remission. 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                           - Congested mucosa in the ileum, 10 cm from the                            ileocecal valve. Biopsied.                           - Congested mucosa in the ascending colon. Biopsied.                           - Diverticulosis in the sigmoid colon, in the                            descending colon and in the ascending colon.                           -  The distal rectum and anal verge are normal on                            retroflexion view. Moderate Sedation:      Per Anesthesia Care Recommendation:           - Discharge patient to home (ambulatory).                           - Resume previous diet.                           - Await pathology results.                           - Repeat colonoscopy in 2 years for surveillance.                           - Smoking cessation.                            - Continue Skyrizi every 8 weeks. 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 All rights reserved. The codes documented in this report are preliminary and upon coder review may  be revised to meet current compliance requirements. [name], MD [name],  03/08/2023 12:17:20 PM This report has been signed electronically. Number of Addenda: 0

## 2023-03-08 NOTE — Discharge Instructions (Signed)
You are being discharged to home.  Resume your previous diet.  We are waiting for your pathology results.  Your physician has recommended a repeat colonoscopy in two years for surveillance.  Smoking cessation. Continue Skyrizi every 8 weeks.

## 2023-03-08 NOTE — Anesthesia Procedure Notes (Signed)
Date/Time: 03/08/2023 11:40 AM  Performed by: Orlie Dakin, CRNAPre-anesthesia Checklist: Patient identified, Emergency Drugs available, Suction available and Patient being monitored Patient Re-evaluated:Patient Re-evaluated prior to induction Oxygen Delivery Method: Nasal cannula Placement Confirmation: positive ETCO2

## 2023-03-08 NOTE — Anesthesia Preprocedure Evaluation (Signed)
Anesthesia Evaluation  Patient identified by MRN, date of birth, ID band Patient awake    Reviewed: Allergy & Precautions, H&P , NPO status , Patient's Chart, lab work & pertinent test results, reviewed documented beta blocker date and time   Airway Mallampati: II  TM Distance: >3 FB Neck ROM: full    Dental no notable dental hx.    Pulmonary neg pulmonary ROS, Current Smoker   Pulmonary exam normal breath sounds clear to auscultation       Cardiovascular Exercise Tolerance: Good negative cardio ROS  Rhythm:regular Rate:Normal     Neuro/Psych  PSYCHIATRIC DISORDERS Anxiety Depression    negative neurological ROS     GI/Hepatic Neg liver ROS,GERD  Medicated,,  Endo/Other  negative endocrine ROS    Renal/GU negative Renal ROS  negative genitourinary   Musculoskeletal   Abdominal   Peds  Hematology negative hematology ROS (+)   Anesthesia Other Findings   Reproductive/Obstetrics negative OB ROS                             Anesthesia Physical Anesthesia Plan  ASA: 2  Anesthesia Plan: General   Post-op Pain Management:    Induction:   PONV Risk Score and Plan: Propofol infusion  Airway Management Planned:   Additional Equipment:   Intra-op Plan:   Post-operative Plan:   Informed Consent: I have reviewed the patients History and Physical, chart, labs and discussed the procedure including the risks, benefits and alternatives for the proposed anesthesia with the patient or authorized representative who has indicated his/her understanding and acceptance.     Dental Advisory Given  Plan Discussed with: CRNA  Anesthesia Plan Comments:        Anesthesia Quick Evaluation

## 2023-03-08 NOTE — H&P (Signed)
Aaron Phillips is an 52 y.o. male.   Chief Complaint: Crohn's disease HPI: Aaron Phillips is a 52 y.o. 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  who presents for follow up of Crohn's disease.   The patient denies having any nausea, vomiting, fever, chills, hematochezia, melena, hematemesis, abdominal distention, abdominal pain, diarrhea, jaundice, pruritus or weight loss. States Aaron Phillips has worked perfectly.  Past Medical History:  Diagnosis Date   Anxiety    Crohn's disease ([place])    As a child he was dx.   Crohn's disease [facility])     Past Surgical History:  Procedure Laterality Date   BIOPSY  04/28/2020   Procedure: BIOPSY;  Surgeon: Rogene Houston, MD;  [facility];  Service: Endoscopy;;   COLONOSCOPY N/A 12/09/2015   Procedure: COLONOSCOPY;  Surgeon: Rogene Houston, MD;  [facility];  Service: Endoscopy;  Laterality: N/A;  230   COLONOSCOPY N/A 04/28/2020   Procedure: COLONOSCOPY;  Surgeon: Rogene Houston, MD;  [facility];  Service: Endoscopy;  Laterality: N/A;  200   INCISION AND DRAINAGE PERIRECTAL ABSCESS N/A 01/10/2022   Procedure: INCISION  AND DRAINAGE PERIRECTAL ABSCESS; seton placement x2;  Surgeon: Virl Cagey, MD;  Location: [facility];  Service: General;  Laterality: N/A;   INGUINAL HERNIA REPAIR  2017    Family History  Problem Relation Age of Onset   Hypertension Mother    Hypertension Father    [name] syndrome Sister    Social History:  reports that he has been smoking cigarettes. He has been smoking an average of .5 packs per day. He has been exposed to tobacco smoke. He has quit using smokeless tobacco.  His smokeless tobacco use included chew. He reports current alcohol use. He reports that he does not use drugs.  Allergies: No Known Allergies  Medications Prior to  Admission  Medication Sig Dispense Refill   cholecalciferol (VITAMIN D3) 25 MCG (1000 UNIT) tablet Take 1,000 Units by mouth as needed.     pantoprazole (PROTONIX) 40 MG tablet TAKE 1 TABLET(40 MG) BY MOUTH DAILY (Patient taking differently: Take 40 mg by mouth daily as needed (Heartburn).) 90 tablet 3   Risankizumab-rzaa (SKYRIZI) 360 MG/2.4ML SOCT Inject 2.4 ml every 8 weeks 2.4 mL 5   dicyclomine (BENTYL) 10 MG capsule Take 1 capsule (10 mg total) by mouth as needed for spasms. 180 capsule 1   Mesalamine 800 MG TBEC Take 1 tablet (800 mg total) by mouth 2 (two) times daily. (Patient not taking: Reported on 03/06/2023) 180 tablet 1   rosuvastatin (CRESTOR) 10 MG tablet Take 10 mg by mouth at bedtime.     Sodium Sulfate-Mag Sulfate-KCl (SUTAB) [phone] MG TABS As directed 24 tablet 0   valACYclovir (VALTREX) 500 MG tablet Take 1 tablet (500 mg total) by mouth daily as needed (fever blisters). 30 tablet 1    No results found for this or any previous visit (from the past 48 hour(s)). No results found.  Review of Systems  All other systems reviewed and are negative.   Blood pressure 118/80, temperature 97.8 F (36.6 C), temperature source Oral, resp. rate 12, height 6\' 2"  (1.88 m), weight 103.4 kg, SpO2 97 %. Physical Exam  GENERAL: The patient is AO x3, in no acute distress. HEENT: Head is normocephalic and atraumatic. EOMI are intact.  Mouth is well hydrated and without lesions. NECK: Supple. No masses LUNGS: Clear to auscultation. No presence of rhonchi/wheezing/rales. Adequate chest expansion HEART: RRR, normal s1 and s2. ABDOMEN: Soft, nontender, no guarding, no peritoneal signs, and nondistended. BS +. No masses. EXTREMITIES: Without any cyanosis, clubbing, rash, lesions or edema. NEUROLOGIC: AOx3, no focal motor deficit. SKIN: no jaundice, no rashes  Assessment/Plan Aaron Phillips is a 52 y.o. male  with PMH ileocolonic Crohn's disease diagnosed at 57 years c/b perianal  fistula and fistulizing disease in the colon, who presents for follow up of Crohn's disease.  who presents for follow up of Crohn's disease.  We will proceed with colonsocopy  Harvel Quale, MD 03/08/2023, 10:34 AM

## 2023-03-09 NOTE — Anesthesia Postprocedure Evaluation (Signed)
Anesthesia Post Note  Patient: Aaron Phillips  Procedure(s) Performed: COLONOSCOPY WITH PROPOFOL BIOPSY  Patient location during evaluation: Phase II Anesthesia Type: General Level of consciousness: awake Pain management: pain level controlled Vital Signs Assessment: post-procedure vital signs reviewed and stable Respiratory status: spontaneous breathing and respiratory function stable Cardiovascular status: blood pressure returned to baseline and stable Postop Assessment: no headache and no apparent nausea or vomiting Anesthetic complications: no Comments: Late entry   No notable events documented.   Last Vitals:  Vitals:   03/08/23 1207 03/08/23 1213  BP: (!) 86/51 103/70  Pulse: 60 68  Resp: 16 18  Temp: (!) 36.4 C   SpO2: 96% 99%    Last Pain:  Vitals:   03/08/23 1213  TempSrc:   PainSc: 0-No pain                 [name]

## 2023-03-11 ENCOUNTER — Encounter (INDEPENDENT_AMBULATORY_CARE_PROVIDER_SITE_OTHER): Payer: Self-pay | Admitting: *Deleted

## 2023-03-11 LAB — SURGICAL PATHOLOGY

## 2023-03-19 ENCOUNTER — Other Ambulatory Visit (INDEPENDENT_AMBULATORY_CARE_PROVIDER_SITE_OTHER): Payer: Self-pay | Admitting: Gastroenterology

## 2023-03-19 ENCOUNTER — Encounter (HOSPITAL_COMMUNITY): Payer: Self-pay | Admitting: Gastroenterology

## 2023-04-02 ENCOUNTER — Other Ambulatory Visit: Payer: Self-pay | Admitting: *Deleted

## 2023-05-24 ENCOUNTER — Telehealth (INDEPENDENT_AMBULATORY_CARE_PROVIDER_SITE_OTHER): Payer: Self-pay

## 2023-05-24 NOTE — Telephone Encounter (Signed)
05/23/2023 Aaron Phillips [address] [place], [place] [address] Plan member ID: [id number] Case number: [id number] Prescriber name: Dolores Frame Prescriber fax: [phone] NOTICE OF APPROVAL Dear Aaron Phillips, [employer], on behalf of [employer], is responsible for reviewing pharmacy services provided to [facility] members. [employer] received a request on 05/23/2023 from your prescriber for coverage of Skyrizi Inj 360/2.4. Your request for Skyrizi Inj 360/2.4 has been approved. How long does this approval last? SKYRIZI INJ 360/2.4, use as directed (2.4 per 42 days), is approved for 12 months through 05/22/2024 or until coverage for the medication is no longer available under your benefit plan or the medication becomes subject to a pharmacy benefit coverage requirement, such as supply limits or notification, whichever occurs first as allowed by law. Please note: Doses/quantities above plan limits and/or maximum Food and Drug Administration (FDA) approved dosing may be subject to further review. Reviewed by: [email or web address], R.Ph. What happens when the authorization expires? It is recommended that your prescriber contact [employer] for continued authorization before your authorization expires so that you do not experience any disruption in therapy. Thank you for your patience and understanding during the review process. If you have an active prescription for this medication, you may now fill. If you have any additional questions, please call [employer] toll-free at [phone]

## 2023-07-16 ENCOUNTER — Inpatient Hospital Stay: Payer: 59 | Attending: Internal Medicine

## 2023-07-16 ENCOUNTER — Ambulatory Visit: Payer: 59 | Admitting: Physician Assistant

## 2023-07-23 ENCOUNTER — Inpatient Hospital Stay: Payer: 59 | Admitting: Physician Assistant

## 2023-08-05 ENCOUNTER — Ambulatory Visit (INDEPENDENT_AMBULATORY_CARE_PROVIDER_SITE_OTHER): Payer: 59 | Admitting: Gastroenterology

## 2023-09-16 ENCOUNTER — Encounter (INDEPENDENT_AMBULATORY_CARE_PROVIDER_SITE_OTHER): Payer: Self-pay | Admitting: Gastroenterology

## 2023-09-16 ENCOUNTER — Ambulatory Visit (INDEPENDENT_AMBULATORY_CARE_PROVIDER_SITE_OTHER): Payer: 59 | Admitting: Gastroenterology

## 2023-09-16 VITALS — BP 126/76 | HR 74 | Temp 98.9°F | Ht 74.0 in | Wt 245.2 lb

## 2023-09-16 DIAGNOSIS — K50913 Crohn's disease, unspecified, with fistula: Secondary | ICD-10-CM | POA: Diagnosis not present

## 2023-09-16 DIAGNOSIS — K50819 Crohn's disease of both small and large intestine with unspecified complications: Secondary | ICD-10-CM

## 2023-09-16 MED ORDER — METRONIDAZOLE 500 MG PO TABS: 500.0000 mg | ORAL_TABLET | Freq: Two times a day (BID) | ORAL | 0 refills | Status: AC

## 2023-09-16 NOTE — Patient Instructions (Addendum)
Perform blood workup Continue with smoking cessation, great job! Continue Skyrizi every 8 weeks Take Flagyl every 12 hours for 4 weeks Continue Bentyl as needed for abdominal pain Get shingles vaccine.

## 2023-09-16 NOTE — Progress Notes (Unsigned)
Katrinka Blazing, M.D. Gastroenterology & Hepatology [facility]enter[facility] [address] [place], [place] [address]  Primary Care Physician: Elfredia Nevins, MD [address] [place] [address]  I will communicate my assessment and recommendations to the referring MD via EMR.  Problems: Ileocolonic fistulizing Crohn's disease  Perianal fistulizing disease in remission    History of Present Illness: Aaron Phillips is a 52 y.o. 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  who presents for follow up of Crohn's disease.  The patient was last seen on 02/04/23. At that time, the patient was encouraged to quit smoking.  He was continued on his Skyrizi every 8 weeks and to continue Bentyl as needed for abdominal pain.  Colonoscopy was performed with findings described below.  Patient reports that he has noted that he has some discharge from the perianal area. States that he had some little fistulas showing up recently, which have been having a constat discharge. There was a fistula with significant pain a month and a half ago, that the treated with some antibiotics he had at hand. He has noticed a difference when he is reaching the 6-7 week of Skyrizi dose.  Has 1 BM every day with normal consistency. The patient denies having any nausea, vomiting, fever, chills, hematochezia, melena, hematemesis, abdominal distention, abdominal pain, diarrhea, jaundice, pruritus or weight loss.  Previous MRE 03/31/2022 IMPRESSION: 1. Unchanged, long segment wall thickening and narrowing of the terminal ileum, involving a segment approximately 25 cm in length from the ileocecal valve. Mild mucosal hyperenhancement without overt inflammatory fat stranding. Findings are compatible with Crohn's disease, without evidence of overt stricture or  complicating obstruction, fistula, or abscess.   Most recent MRE from 11/30/2022 showed improved appearance of enteritis in the distal neoterminal ileum.  Again, evaluation of the pelvic area was limited as this was not evaluated in this study.  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 - Congested mucosa in the ileum, 10 cm from the ileocecal valve. Biopsied. - Congested mucosa in the ascending colon. Biopsied. - Diverticulosis in the sigmoid colon, in the descending colon and in the ascending colon. - The distal rectum and anal verge are normal on retroflexion view.  Path A. SMALL BOWEL, BIOPSY:       Chronic inactive enteritis with submucosal fibrosis.       Negative for granuloma, dysplasia or malignancy.   B. COLON, ASCENDING, BIOPSY:       Colonic mucosa with focal mild congestion.       Negative for activity,  chronicity, granuloma, dysplasia or  malignancy.   Recommend a repeat colonoscopy in 2 years  Past Medical History: Past Medical History:  Diagnosis Date   Anxiety    Crohn's disease ([facility])    As a child he was dx.   Crohn's disease [facility])     Past Surgical History: Past Surgical History:  Procedure Laterality Date   BIOPSY  04/28/2020   Procedure: BIOPSY;  Surgeon: Malissa Hippo, MD;  [facility];  Service: Endoscopy;;   BIOPSY  03/08/2023   Procedure: BIOPSY;  Surgeon: Marguerita Merles, Reuel Boom, MD;  [facility];  Service: Gastroenterology;;   COLONOSCOPY N/A 12/09/2015   Procedure: COLONOSCOPY;  Surgeon: Malissa Hippo, MD;  [facility];  Service: Endoscopy;  Laterality: N/A;  230   COLONOSCOPY N/A 04/28/2020   Procedure:  COLONOSCOPY;  Surgeon: Malissa Hippo, MD;  [facility];  Service: Endoscopy;  Laterality: N/A;  200   COLONOSCOPY WITH PROPOFOL N/A 03/08/2023   Procedure: COLONOSCOPY WITH PROPOFOL;  Surgeon: Dolores Frame, MD;  [facility];  Service: Gastroenterology;  Laterality: N/A;  11:30am, asa 1-2   INCISION AND DRAINAGE PERIRECTAL ABSCESS N/A 01/10/2022   Procedure: INCISION  AND DRAINAGE PERIRECTAL ABSCESS; seton placement x2;  Surgeon: Lucretia Roers, MD;  Location[facility];  Service: General;  Laterality: N/A;   INGUINAL HERNIA REPAIR  2017    Family History: Family History  Problem Relation Age of Onset   Hypertension Mother    Hypertension Father    Evelene Croon Parkinson White syndrome Sister     Social History: Social History   Tobacco Use  Smoking Status Every Day   Current packs/day: 0.50   Types: Cigarettes   Passive exposure: Current  Smokeless Tobacco Former   Types: Chew  Tobacco Comments   Patient states that he uses dip occasional   Social History   Substance and Sexual Activity  Alcohol Use Yes   Comment: occas   Social History   Substance and Sexual Activity  Drug Use No    Allergies: No Known Allergies  Medications: Current Outpatient Medications  Medication Sig Dispense Refill   cholecalciferol (VITAMIN D3) 25 MCG (1000 UNIT) tablet Take 1,000 Units by mouth as needed.     dicyclomine (BENTYL) 10 MG capsule Take 1 capsule (10 mg total) by mouth as needed for spasms. 180 capsule 1   pantoprazole (PROTONIX) 40 MG tablet TAKE 1 TABLET(40 MG) BY MOUTH DAILY (Patient taking differently: Take 40 mg by mouth daily as needed (Heartburn).) 90 tablet 3   Risankizumab-rzaa (SKYRIZI) 360 MG/2.4ML SOCT INJECT THE CONTENTS OF 1  CARTRIDGE SUBCUTANEOUSLY EVERY 8 WEEKS 2.4 mL 5   valACYclovir (VALTREX) 500 MG tablet Take 1 tablet (500 mg total) by mouth daily as needed (fever blisters). 30 tablet 1   No current facility-administered  medications for this visit.    Review of Systems: GENERAL: negative for malaise, night sweats HEENT: No changes in hearing or vision, no nose bleeds or other nasal problems. NECK: Negative for lumps, goiter, pain and significant neck swelling RESPIRATORY: Negative for cough, wheezing CARDIOVASCULAR: Negative for chest pain, leg swelling, palpitations, orthopnea GI: SEE HPI MUSCULOSKELETAL: Negative for joint pain or swelling, back pain, and muscle pain. SKIN: Negative for lesions, rash PSYCH: Negative for sleep disturbance, mood disorder and recent psychosocial stressors. HEMATOLOGY Negative for prolonged bleeding, bruising easily, and swollen nodes. ENDOCRINE: Negative for cold or heat intolerance, polyuria, polydipsia and goiter. NEURO: negative for tremor, gait imbalance, syncope and seizures. The remainder of the review of systems is noncontributory.   Physical Exam: BP 126/76   Pulse 74   Temp 98.9 F (37.2 C)   Ht 6\' 2"  (1.88 m)   Wt 245 lb 3.2 oz (111.2 kg)   BMI 31.48 kg/m  GENERAL: The patient is AO x3, in no acute distress. HEENT: Head is normocephalic and atraumatic. EOMI are intact. Mouth is well hydrated and without lesions. NECK: Supple. No masses LUNGS: Clear to auscultation. No presence of rhonchi/wheezing/rales. Adequate  chest expansion HEART: RRR, normal s1 and s2. ABDOMEN: Soft, nontender, no guarding, no peritoneal signs, and nondistended. BS +. No masses. RECTAL EXAM: presence of 5 non indurated small fistulae openings without drainage (4 located at 6 o clock and 1 located at 8 o clock)  normal tone, no masses, brown stool without blood. Chaperone: Marlowe Shores CMA EXTREMITIES: Without any cyanosis, clubbing, rash, lesions or edema. NEUROLOGIC: AOx3, no focal motor deficit. SKIN: no jaundice, no rashes  Imaging/Labs: as above  I personally reviewed and interpreted the available labs, imaging and endoscopic files.  Impression and Plan: Aaron Phillips is a 52 y.o. male coming for follow up of ***   All questions were answered.      Katrinka Blazing, MD Gastroenterology and Hepatology [facility]

## 2023-09-18 LAB — CBC WITH DIFFERENTIAL/PLATELET
Absolute Monocytes: 491 cells/uL (ref 200–950)
Basophils Absolute: 118 cells/uL (ref 0–200)
Basophils Relative: 1.3 %
Eosinophils Absolute: 355 cells/uL (ref 15–500)
Eosinophils Relative: 3.9 %
HCT: 48.7 % (ref 38.5–50.0)
Hemoglobin: 15.9 g/dL (ref 13.2–17.1)
Lymphs Abs: 2129 cells/uL (ref 850–3900)
MCH: 30.8 pg (ref 27.0–33.0)
MCHC: 32.6 g/dL (ref 32.0–36.0)
MCV: 94.2 fL (ref 80.0–100.0)
MPV: 10.5 fL (ref 7.5–12.5)
Monocytes Relative: 5.4 %
Neutro Abs: 6006 cells/uL (ref 1500–7800)
Neutrophils Relative %: 66 %
Platelets: 355 10*3/uL (ref 140–400)
RBC: 5.17 10*6/uL (ref 4.20–5.80)
RDW: 12.4 % (ref 11.0–15.0)
Total Lymphocyte: 23.4 %
WBC: 9.1 10*3/uL (ref 3.8–10.8)

## 2023-09-18 LAB — COMPREHENSIVE METABOLIC PANEL
AG Ratio: 1.5 (calc) (ref 1.0–2.5)
ALT: 17 U/L (ref 9–46)
AST: 14 U/L (ref 10–35)
Albumin: 4 g/dL (ref 3.6–5.1)
Alkaline phosphatase (APISO): 60 U/L (ref 35–144)
BUN/Creatinine Ratio: 7 (calc) (ref 6–22)
BUN: 10 mg/dL (ref 7–25)
CO2: 24 mmol/L (ref 20–32)
Calcium: 9 mg/dL (ref 8.6–10.3)
Chloride: 103 mmol/L (ref 98–110)
Creat: 1.44 mg/dL — ABNORMAL HIGH (ref 0.70–1.30)
Globulin: 2.6 g/dL (calc) (ref 1.9–3.7)
Glucose, Bld: 116 mg/dL — ABNORMAL HIGH (ref 65–99)
Potassium: 4.5 mmol/L (ref 3.5–5.3)
Sodium: 137 mmol/L (ref 135–146)
Total Bilirubin: 0.8 mg/dL (ref 0.2–1.2)
Total Protein: 6.6 g/dL (ref 6.1–8.1)

## 2023-09-18 LAB — C-REACTIVE PROTEIN: CRP: 3.8 mg/L (ref <8.0)

## 2023-09-20 ENCOUNTER — Encounter (INDEPENDENT_AMBULATORY_CARE_PROVIDER_SITE_OTHER): Payer: Self-pay

## 2024-01-08 ENCOUNTER — Other Ambulatory Visit (INDEPENDENT_AMBULATORY_CARE_PROVIDER_SITE_OTHER): Payer: Self-pay | Admitting: Gastroenterology

## 2024-03-16 ENCOUNTER — Encounter (INDEPENDENT_AMBULATORY_CARE_PROVIDER_SITE_OTHER): Payer: Self-pay

## 2024-03-16 ENCOUNTER — Telehealth (INDEPENDENT_AMBULATORY_CARE_PROVIDER_SITE_OTHER): Payer: Self-pay | Admitting: Gastroenterology

## 2024-03-16 ENCOUNTER — Ambulatory Visit (INDEPENDENT_AMBULATORY_CARE_PROVIDER_SITE_OTHER): Payer: 59 | Admitting: Gastroenterology

## 2024-03-16 ENCOUNTER — Other Ambulatory Visit (INDEPENDENT_AMBULATORY_CARE_PROVIDER_SITE_OTHER): Payer: Self-pay

## 2024-03-16 DIAGNOSIS — K603 Anal fistula, unspecified: Secondary | ICD-10-CM

## 2024-03-16 DIAGNOSIS — Z1159 Encounter for screening for other viral diseases: Secondary | ICD-10-CM

## 2024-03-16 DIAGNOSIS — Z111 Encounter for screening for respiratory tuberculosis: Secondary | ICD-10-CM

## 2024-03-16 DIAGNOSIS — Z862 Personal history of diseases of the blood and blood-forming organs and certain disorders involving the immune mechanism: Secondary | ICD-10-CM

## 2024-03-16 DIAGNOSIS — K50819 Crohn's disease of both small and large intestine with unspecified complications: Secondary | ICD-10-CM

## 2024-03-16 DIAGNOSIS — Z8639 Personal history of other endocrine, nutritional and metabolic disease: Secondary | ICD-10-CM

## 2024-03-16 NOTE — Telephone Encounter (Signed)
 Pt rescheduled his appointment for today to come see Dr Levon Hedger on  3/31 at 310 and asked if he could have his labs done prior to his OV so Dr Levon Hedger will have the results during his appointment. Please advise. [phone]

## 2024-03-16 NOTE — Telephone Encounter (Signed)
 Yes, please send an order for CBC, CMP, CRP, iron panel, vitamin D, QuantiFERON and hepatitis B surface antigen. Thanks

## 2024-03-16 NOTE — Telephone Encounter (Signed)
 Lab orders placed in Epic for the patient to have labs drawn prior to appointment for [facility]. I have sent a My Chart message to the patient letting him know.

## 2024-03-17 ENCOUNTER — Encounter (INDEPENDENT_AMBULATORY_CARE_PROVIDER_SITE_OTHER): Payer: Self-pay

## 2024-03-17 ENCOUNTER — Ambulatory Visit (INDEPENDENT_AMBULATORY_CARE_PROVIDER_SITE_OTHER): Payer: 59 | Admitting: Gastroenterology

## 2024-03-17 NOTE — Telephone Encounter (Signed)
 Patient made aware the labs have been placed in epic for him to go any time between now and appointment date.

## 2024-03-23 ENCOUNTER — Encounter (INDEPENDENT_AMBULATORY_CARE_PROVIDER_SITE_OTHER): Payer: Self-pay | Admitting: Gastroenterology

## 2024-03-23 ENCOUNTER — Ambulatory Visit (INDEPENDENT_AMBULATORY_CARE_PROVIDER_SITE_OTHER): Admitting: Gastroenterology

## 2024-03-23 VITALS — BP 124/85 | HR 87 | Temp 97.8°F | Ht 74.0 in | Wt 239.2 lb

## 2024-03-23 DIAGNOSIS — K50819 Crohn's disease of both small and large intestine with unspecified complications: Secondary | ICD-10-CM

## 2024-03-23 DIAGNOSIS — K603 Anal fistula, unspecified: Secondary | ICD-10-CM

## 2024-03-23 DIAGNOSIS — K50813 Crohn's disease of both small and large intestine with fistula: Secondary | ICD-10-CM

## 2024-03-23 MED ORDER — CIPROFLOXACIN HCL 500 MG PO TABS: 500.0000 mg | ORAL_TABLET | Freq: Two times a day (BID) | ORAL | 0 refills | Status: AC

## 2024-03-23 NOTE — Patient Instructions (Signed)
 Continue Skyrizi every 8 weeks Continue avoiding tobacco use Will send prescription for ciprofloxacin for 4 weeks, can take if you notice increased drainage in the perianal area Please obtain shingles and pneumonia vaccination Follow-up with dermatologist this year

## 2024-03-23 NOTE — Progress Notes (Unsigned)
 Katrinka Blazing, M.D. Gastroenterology & Hepatology [facility] Gastroenterology [address] [place], [place] [address]  Primary Care Physician: Elfredia Nevins, MD [address] [place] [address]  I will communicate my assessment and recommendations to the referring MD via EMR.  Problems: Ileocolonic fistulizing Crohn's disease  Perianal fistulizing disease in remission    History of Present Illness: Aaron Phillips is a 53 y.o. male  with PMH ileocolonic Crohn's disease diagnosed at 29 years c/b perianal fistula and fistulizing disease in the colon, who presents for follow up of Crohn's disease.  who presents for follow up of Crohn's disease.  The patient was last seen on 09/16/2023. At that time, the patient was advised to continue Skyrizi every 8 weeks and to continue with smoking cessation.  As he had some discharge from perianal disease, he was given Flagyl for 4 weeks.  Was counseled to get shingles vaccination.  Patient reports that around 6-8 weeks after last dose of Skyrizi, he notices his drainage from perianal area increases a little more. He is having a bowel movement per day, sometimes two without melena or hematochezia. States stools may be mostly 2 per day at the end of his Skyrizi cycle.  Most recent labs from 03/20/2024 showed normal vitamin D, iron stores, CRP, CMP and CBC.  The patient denies having any nausea, vomiting, fever, chills, hematochezia, melena, hematemesis, abdominal distention, abdominal pain, jaundice, pruritus or weight loss.  Not smoking anymore.  Previous medications: mesalamine  Last flu shot:2022 but had the flu in 2023 Last pneumonia shot:never Last evaluation by dermatology: many years ago but wife looks for skin frequently Last zoster vaccine: never, but considering Last DEXA scan: never, not interested COVID-19 shot: Moderna x1 Last time TB and hepatitis B testing were performed: 03/20/2024  Last  Colonoscopy: 03/08/2023 - Scar from perianal fistula found on perianal exam. - Congested mucosa in the ileum, 10 cm from the ileocecal valve. Biopsied. - Congested mucosa in the ascending colon. Biopsied. - Diverticulosis in the sigmoid colon, in the descending colon and in the ascending colon. - The distal rectum and anal verge are normal on retroflexion view.   Path A. SMALL BOWEL, BIOPSY:       Chronic inactive enteritis with submucosal fibrosis.       Negative for granuloma, dysplasia or malignancy.   B. COLON, ASCENDING, BIOPSY:       Colonic mucosa with focal mild congestion.       Negative for activity,  chronicity, granuloma, dysplasia or  malignancy.    Recommend a repeat colonoscopy in 2 years  Past Medical History: Past Medical History:  Diagnosis Date   Anxiety    Crohn's disease (HCC)    As a child he was dx.   Crohn's disease [facility])     Past Surgical History: Past Surgical History:  Procedure Laterality Date   BIOPSY  04/28/2020   Procedure: BIOPSY;  Surgeon: Malissa Hippo, MD;  [facility];  Service: Endoscopy;;   BIOPSY  03/08/2023   Procedure: BIOPSY;  Surgeon: Dolores Frame, MD;  [facility];  Service: Gastroenterology;;   COLONOSCOPY N/A 12/09/2015   Procedure: COLONOSCOPY;  Surgeon: Malissa Hippo, MD;  [facility];  Service: Endoscopy;  Laterality: N/A;  230   COLONOSCOPY N/A 04/28/2020   Procedure: COLONOSCOPY;  Surgeon: Malissa Hippo, MD;  [facility];  Service: Endoscopy;  Laterality: N/A;  200   COLONOSCOPY WITH PROPOFOL  N/A 03/08/2023   Procedure: COLONOSCOPY WITH PROPOFOL;  Surgeon: Dolores Frame, MD;  [facility];  Service: Gastroenterology;  Laterality: N/A;  11:30am, asa 1-2   INCISION AND DRAINAGE PERIRECTAL ABSCESS N/A 01/10/2022   Procedure: INCISION  AND DRAINAGE PERIRECTAL ABSCESS; seton placement x2;  Surgeon: Lucretia Roers, MD;  [facility];  Service:  General;  Laterality: N/A;   INGUINAL HERNIA REPAIR  2017    Family History: Family History  Problem Relation Age of Onset   Hypertension Mother    Hypertension Father    Evelene Croon Parkinson White syndrome Sister     Social History: Social History   Tobacco Use  Smoking Status Every Day   Current packs/day: 0.50   Types: Cigarettes   Passive exposure: Current  Smokeless Tobacco Former   Types: Chew  Tobacco Comments   Patient states that he uses dip nicotine Salts occasional   Social History   Substance and Sexual Activity  Alcohol Use Yes   Comment: occas   Social History   Substance and Sexual Activity  Drug Use No    Allergies: No Known Allergies  Medications: Current Outpatient Medications  Medication Sig Dispense Refill   cholecalciferol (VITAMIN D3) 25 MCG (1000 UNIT) tablet Take 1,000 Units by mouth as needed.     pantoprazole (PROTONIX) 40 MG tablet TAKE 1 TABLET(40 MG) BY MOUTH DAILY (Patient taking differently: Take 40 mg by mouth daily as needed (Heartburn).) 90 tablet 3   SKYRIZI 360 MG/2.4ML SOCT INJECT THE CONTENTS OF 1  CARTRIDGE SUBCUTANEOUSLY EVERY 8 WEEKS 2.4 mL 5   valACYclovir (VALTREX) 500 MG tablet Take 1 tablet (500 mg total) by mouth daily as needed (fever blisters). 30 tablet 1   dicyclomine (BENTYL) 10 MG capsule Take 1 capsule (10 mg total) by mouth as needed for spasms. (Patient not taking: Reported on 03/23/2024) 180 capsule 1   No current facility-administered medications for this visit.    Review of Systems: GENERAL: negative for malaise, night sweats HEENT: No changes in hearing or vision, no nose bleeds or other nasal problems. NECK: Negative for lumps, goiter, pain and significant neck swelling RESPIRATORY: Negative for cough, wheezing CARDIOVASCULAR: Negative for chest pain, leg swelling, palpitations, orthopnea GI: SEE HPI MUSCULOSKELETAL: Negative for joint pain or swelling, back pain, and muscle pain. SKIN: Negative for  lesions, rash PSYCH: Negative for sleep disturbance, mood disorder and recent psychosocial stressors. HEMATOLOGY Negative for prolonged bleeding, bruising easily, and swollen nodes. ENDOCRINE: Negative for cold or heat intolerance, polyuria, polydipsia and goiter. NEURO: negative for tremor, gait imbalance, syncope and seizures. The remainder of the review of systems is noncontributory.   Physical Exam: BP 124/85 (BP Location: Left Arm, Patient Position: Sitting, Cuff Size: Large)   Pulse 87   Temp 97.8 F (36.6 C) (Temporal)   Ht 6\' 2"  (1.88 m)   Wt 239 lb 3.2 oz (108.5 kg)   BMI 30.71 kg/m  GENERAL: The patient is AO x3, in no acute distress. HEENT: Head is normocephalic and atraumatic. EOMI are intact. Mouth is well hydrated and without lesions. NECK: Supple. No masses LUNGS: Clear to auscultation. No presence of rhonchi/wheezing/rales. Adequate chest expansion HEART: RRR, normal s1 and s2. ABDOMEN: Soft, nontender, no guarding, no peritoneal signs, and nondistended. BS +. No masses. RECTAL EXAM: no external lesions, normal tone, no masses, brown stool without blood.*** Chaperone: EXTREMITIES: Without any cyanosis, clubbing, rash, lesions or edema. NEUROLOGIC: AOx3, no focal motor deficit. SKIN: no jaundice, no rashes  Imaging/Labs: as above  I personally reviewed and interpreted the available labs, imaging and endoscopic files.  Impression and Plan: Aaron Phillips is a 53 y.o. male coming for follow up of ***   All questions were answered.      Katrinka Blazing, MD Gastroenterology and Hepatology [facility] Gastroenterology

## 2024-03-24 ENCOUNTER — Encounter (INDEPENDENT_AMBULATORY_CARE_PROVIDER_SITE_OTHER): Payer: Self-pay

## 2024-03-24 LAB — COMPREHENSIVE METABOLIC PANEL WITH GFR
AG Ratio: 1.3 (calc) (ref 1.0–2.5)
ALT: 24 U/L (ref 9–46)
AST: 23 U/L (ref 10–35)
Albumin: 4.3 g/dL (ref 3.6–5.1)
Alkaline phosphatase (APISO): 55 U/L (ref 35–144)
BUN: 14 mg/dL (ref 7–25)
CO2: 26 mmol/L (ref 20–32)
Calcium: 9.2 mg/dL (ref 8.6–10.3)
Chloride: 98 mmol/L (ref 98–110)
Creat: 0.95 mg/dL (ref 0.70–1.30)
Globulin: 3.2 g/dL (ref 1.9–3.7)
Glucose, Bld: 87 mg/dL (ref 65–99)
Potassium: 4.5 mmol/L (ref 3.5–5.3)
Sodium: 135 mmol/L (ref 135–146)
Total Bilirubin: 1 mg/dL (ref 0.2–1.2)
Total Protein: 7.5 g/dL (ref 6.1–8.1)
eGFR: 97 mL/min/{1.73_m2} (ref >=60)

## 2024-03-24 LAB — CBC WITH DIFFERENTIAL/PLATELET
Absolute Lymphocytes: 2529 {cells}/uL (ref 850–3900)
Absolute Monocytes: 720 {cells}/uL (ref 200–950)
Basophils Absolute: 117 {cells}/uL (ref 0–200)
Basophils Relative: 1.3 %
Eosinophils Absolute: 288 {cells}/uL (ref 15–500)
Eosinophils Relative: 3.2 %
HCT: 48.7 % (ref 38.5–50.0)
Hemoglobin: 16.5 g/dL (ref 13.2–17.1)
MCH: 30.7 pg (ref 27.0–33.0)
MCHC: 33.9 g/dL (ref 32.0–36.0)
MCV: 90.5 fL (ref 80.0–100.0)
MPV: 9.4 fL (ref 7.5–12.5)
Monocytes Relative: 8 %
Neutro Abs: 5346 {cells}/uL (ref 1500–7800)
Neutrophils Relative %: 59.4 %
Platelets: 354 10*3/uL (ref 140–400)
RBC: 5.38 10*6/uL (ref 4.20–5.80)
RDW: 12.3 % (ref 11.0–15.0)
Total Lymphocyte: 28.1 %
WBC: 9 10*3/uL (ref 3.8–10.8)

## 2024-03-24 LAB — VITAMIN D 25 HYDROXY (VIT D DEFICIENCY, FRACTURES): Vit D, 25-Hydroxy: 38 ng/mL (ref 30–100)

## 2024-03-24 LAB — IRON,TIBC AND FERRITIN PANEL
%SAT: 31 % (ref 20–48)
Ferritin: 80 ng/mL (ref 38–380)
Iron: 90 ug/dL (ref 50–180)
TIBC: 291 ug/dL (ref 250–425)

## 2024-03-24 LAB — QUANTIFERON-TB GOLD PLUS
Mitogen-NIL: 6.79 [IU]/mL
NIL: 0.04 [IU]/mL
QuantiFERON-TB Gold Plus: NEGATIVE
TB1-NIL: <0 [IU]/mL
TB2-NIL: <0 [IU]/mL

## 2024-03-24 LAB — C-REACTIVE PROTEIN: CRP: 3.4 mg/L (ref <8.0)

## 2024-03-24 LAB — HEPATITIS B SURFACE ANTIGEN: Hepatitis B Surface Ag: NONREACTIVE

## 2024-04-23 ENCOUNTER — Telehealth (INDEPENDENT_AMBULATORY_CARE_PROVIDER_SITE_OTHER): Payer: Self-pay

## 2024-04-23 NOTE — Telephone Encounter (Signed)
 04/23/2024 Aaron Phillips [address] [name], [name] Plan member ID: [id number] Case number: [id number] Prescriber name: [name] Prescriber fax: [phone] NOTICE OF APPROVAL Dear [name], [employer], on behalf of [employer], is responsible for reviewing pharmacy services provided to [name] members. [employer] [facility] received a request on 04/23/2024 from your prescriber for coverage of Skyrizi  Inj 360/2.4. Your request for Skyrizi  Inj 360/2.4 has been approved. How long does this approval last? SKYRIZI  INJ 360/2.4, use as directed, is approved through 04/23/2025 or until coverage for the medication is no longer available under your benefit plan or the medication becomes subject to a pharmacy benefit coverage requirement, such as supply limits or notification, whichever occurs first as allowed by law. Please note: Doses/quantities above plan limits and/or maximum Food and Drug Administration (FDA) approved dosing may be subject to further review. What happens when the authorization expires? It is recommended that your prescriber contact [facility] for continued authorization before your authorization expires so that you do not experience any disruption in therapy. Thank you for your patience and understanding during the review process. If you have an active prescription for this medication, you may now fill. If you have any additional questions, please call [facility] toll-free at [phone]. Sincerely, [phone] All [employer] trademarks and logos are owned by [employer]. All other brand or product names are trademarks or registered marks of their respective owners. All rights reserved. Page 2 of 4 This document and others if attached contain information from [name] that is proprietary, confidential and/or may contain protected health information (PHI). We are required to safeguard PHI by applicable law. The information in  this document is for the sole use of the person(s) or company named above. If you received this document by mistake, please know that sharing, copying, distributing or using information in this document is against the law. If you are not the intended recipient, please notify the sender immediately and return the document(s) by mail to [name] [address] [place], [place] [address]. [employer] [facility] cc: [name]

## 2024-06-11 ENCOUNTER — Encounter (INDEPENDENT_AMBULATORY_CARE_PROVIDER_SITE_OTHER): Payer: Self-pay

## 2024-08-05 ENCOUNTER — Encounter (INDEPENDENT_AMBULATORY_CARE_PROVIDER_SITE_OTHER): Payer: Self-pay

## 2024-08-28 ENCOUNTER — Encounter (INDEPENDENT_AMBULATORY_CARE_PROVIDER_SITE_OTHER): Payer: Self-pay | Admitting: Gastroenterology

## 2024-10-07 ENCOUNTER — Encounter (INDEPENDENT_AMBULATORY_CARE_PROVIDER_SITE_OTHER): Payer: Self-pay | Admitting: Gastroenterology

## 2024-10-12 ENCOUNTER — Other Ambulatory Visit (HOSPITAL_BASED_OUTPATIENT_CLINIC_OR_DEPARTMENT_OTHER): Payer: Self-pay | Admitting: Family Medicine

## 2024-10-12 DIAGNOSIS — Z8249 Family history of ischemic heart disease and other diseases of the circulatory system: Secondary | ICD-10-CM

## 2024-10-28 ENCOUNTER — Ambulatory Visit (HOSPITAL_BASED_OUTPATIENT_CLINIC_OR_DEPARTMENT_OTHER)
Admission: RE | Admit: 2024-10-28 | Discharge: 2024-10-28 | Disposition: A | Discharge status: Home / Self Care | Payer: Self-pay | Source: Ambulatory Visit | Attending: Family Medicine | Admitting: Family Medicine

## 2024-10-28 DIAGNOSIS — Z8249 Family history of ischemic heart disease and other diseases of the circulatory system: Secondary | ICD-10-CM

## 2024-11-05 ENCOUNTER — Other Ambulatory Visit (INDEPENDENT_AMBULATORY_CARE_PROVIDER_SITE_OTHER): Payer: Self-pay | Admitting: Gastroenterology

## 2024-12-09 ENCOUNTER — Ambulatory Visit (INDEPENDENT_AMBULATORY_CARE_PROVIDER_SITE_OTHER): Admitting: Gastroenterology

## 2024-12-09 ENCOUNTER — Encounter (INDEPENDENT_AMBULATORY_CARE_PROVIDER_SITE_OTHER): Payer: Self-pay | Admitting: Gastroenterology

## 2024-12-09 VITALS — BP 117/73 | HR 73 | Temp 97.4°F | Ht 74.0 in | Wt 240.5 lb

## 2024-12-09 DIAGNOSIS — K50813 Crohn's disease of both small and large intestine with fistula: Secondary | ICD-10-CM

## 2024-12-09 DIAGNOSIS — Z5181 Encounter for therapeutic drug level monitoring: Secondary | ICD-10-CM

## 2024-12-09 DIAGNOSIS — K50913 Crohn's disease, unspecified, with fistula: Secondary | ICD-10-CM

## 2024-12-09 DIAGNOSIS — Z8619 Personal history of other infectious and parasitic diseases: Secondary | ICD-10-CM

## 2024-12-09 DIAGNOSIS — Z79620 Long term (current) use of immunosuppressive biologic: Secondary | ICD-10-CM | POA: Diagnosis not present

## 2024-12-09 DIAGNOSIS — Z8639 Personal history of other endocrine, nutritional and metabolic disease: Secondary | ICD-10-CM | POA: Insufficient documentation

## 2024-12-09 DIAGNOSIS — Z862 Personal history of diseases of the blood and blood-forming organs and certain disorders involving the immune mechanism: Secondary | ICD-10-CM | POA: Insufficient documentation

## 2024-12-09 NOTE — Patient Instructions (Signed)
 It was very nice to meet you today, as dicussed with will plan for the following :  1) labs

## 2024-12-09 NOTE — Progress Notes (Signed)
 Aaron Phillips , M.D. Gastroenterology & Hepatology [facility] Gastroenterology [address] [place], [place] [address] Primary Care Physician: No primary care provider on file. No primary provider on file.  Problem     Ileocolonic fistulizing Crohn's disease  Perianal fistulizing disease in remission   History of Present Illness: Aaron Phillips is a 53 year old male with a history of ileocolonic Crohns disease diagnosed at age 24, complicated by perianal fistula and fistulizing colonic disease, presenting for routine follow-up.   He was last seen by Dr. Eartha on 02/2024 and advised to continue risankizumab  (Skyrizi ) every 8 weeks, pursue smoking cessation, and was treated with a 4-week course of ciprofloxacin  for perianal drainage. He was also counseled regarding shingles vaccination.  Since the last visit, the patient reports improvement in perianal drainage . toward the end of the Skyrizi  dosing cycle (6-8 weeks post-dose) feels bowel frequency increases . He has 1-2 bowel movements daily without melena or hematochezia. He denies abdominal pain, nausea, vomiting, fevers, chills, weight loss, jaundice, pruritus, or GI bleeding. He has successfully quit smoking.  Laboratory studies from 03/20/2024 demonstrated normal CBC, CMP, CRP, iron studies, and vitamin D  levels.  Last flu shot:2022 but had the flu in 2023 Last pneumonia shot:never Last evaluation by dermatology: many years ago but wife looks for skin frequently Last zoster vaccine: never, but considering Last DEXA scan: never, not interested COVID-19 shot: Moderna x1 Last time TB and hepatitis B testing were performed: 03/20/2024   Last Colonoscopy: 03/08/2023 - Scar from perianal fistula found on perianal exam. - Congested mucosa in the ileum, 10 cm from the ileocecal valve. Biopsied. - Congested mucosa in the ascending colon. Biopsied. - Diverticulosis in the sigmoid colon, in the  descending colon and in the ascending colon. - The distal rectum and anal verge are normal on retroflexion view.   Path A. SMALL BOWEL, BIOPSY:       Chronic inactive enteritis with submucosal fibrosis.       Negative for granuloma, dysplasia or malignancy.   B. COLON, ASCENDING, BIOPSY:       Colonic mucosa with focal mild congestion.       Negative for activity,  chronicity, granuloma, dysplasia or  malignancy.    Recommend a repeat colonoscopy in 2 years  Past Medical History: Past Medical History:  Diagnosis Date   Anxiety    Crohn's disease (HCC)    As a child he was dx.   Crohn's disease [facility])     Past Surgical History: Past Surgical History:  Procedure Laterality Date   BIOPSY  04/28/2020   Procedure: BIOPSY;  Surgeon: Aaron Aaron PENNER, MD;  [facility];  Service: Endoscopy;;   BIOPSY  03/08/2023   Procedure: BIOPSY;  Surgeon: Eartha Angelia Sieving, MD;  [facility];  Service: Gastroenterology;;   COLONOSCOPY N/A 12/09/2015   Procedure: COLONOSCOPY;  Surgeon: Aaron Phillips Golda, MD;  [facility];  Service: Endoscopy;  Laterality: N/A;  230   COLONOSCOPY N/A 04/28/2020   Procedure: COLONOSCOPY;  Surgeon: Aaron Aaron PENNER, MD;  [facility];  Service: Endoscopy;  Laterality: N/A;  200   COLONOSCOPY WITH PROPOFOL  N/A 03/08/2023   Procedure: COLONOSCOPY WITH PROPOFOL ;  Surgeon: Eartha Angelia Sieving, MD;  [facility];  Service: Gastroenterology;  Laterality: N/A;  11:30am, asa 1-2   INCISION AND DRAINAGE PERIRECTAL ABSCESS N/A 01/10/2022   Procedure: INCISION  AND DRAINAGE PERIRECTAL ABSCESS; seton placement x2;  Surgeon:  Aaron Manuelita BROCKS, MD;  [facility]: [facility];  Service: General;  Laterality: N/A;   INGUINAL HERNIA REPAIR  2017    Family History: Family History  Problem Relation Age of Onset   Hypertension Mother    Hypertension Father    Aaron Phillips syndrome Sister     Social History:Tobacco Use  History[1] Social History   Substance and Sexual Activity  Alcohol Use Yes   Comment: occas   Social History   Substance and Sexual Activity  Drug Use No    Allergies: Allergies[2]  Medications: Current Outpatient Medications  Medication Sig Dispense Refill   cholecalciferol (VITAMIN D3) 25 MCG (1000 UNIT) tablet Take 1,000 Units by mouth as needed.     dicyclomine  (BENTYL ) 10 MG capsule Take 1 capsule (10 mg total) by mouth as needed for spasms. (Patient not taking: Reported on 03/23/2024) 180 capsule 1   pantoprazole  (PROTONIX ) 40 MG tablet TAKE 1 TABLET(40 MG) BY MOUTH DAILY (Patient taking differently: Take 40 mg by mouth daily as needed (Heartburn).) 90 tablet 3   Risankizumab -rzaa (SKYRIZI ) 360 MG/2.4ML SOCT USE THE ON-BODY INJECTOR TO  ADMINISTER THE CONTENTS OF 1  CARTRIDGE SUBCUTANEOUSLY EVERY 8 WEEKS 2.4 mL 5   valACYclovir  (VALTREX ) 500 MG tablet Take 1 tablet (500 mg total) by mouth daily as needed (fever blisters). 30 tablet 1   No current facility-administered medications for this visit.    Review of Systems: GENERAL: negative for malaise, night sweats HEENT: No changes in hearing or vision, no nose bleeds or other nasal problems. NECK: Negative for lumps, goiter, pain and significant neck swelling RESPIRATORY: Negative for cough, wheezing CARDIOVASCULAR: Negative for chest pain, leg swelling, palpitations, orthopnea GI: SEE HPI MUSCULOSKELETAL: Negative for joint pain or swelling, back pain, and muscle pain. SKIN: Negative for lesions, rash HEMATOLOGY Negative for prolonged bleeding, bruising easily, and swollen nodes. ENDOCRINE: Negative for cold or heat intolerance, polyuria, polydipsia and goiter. NEURO: negative for tremor, gait imbalance, syncope and seizures. The remainder of the review of systems is noncontributory.   Physical Exam: There were no vitals taken for this visit. GENERAL: The patient is AO x3, in no acute distress. HEENT: Head is  normocephalic and atraumatic. EOMI are intact. Mouth is well hydrated and without lesions. NECK: Supple. No masses LUNGS: Clear to auscultation. No presence of rhonchi/wheezing/rales. Adequate chest expansion HEART: RRR, normal s1 and s2. ABDOMEN: Soft, nontender, no guarding, no peritoneal signs, and nondistended. BS +. No masses.   Imaging/Labs: as above     Latest Ref Rng & Units 03/20/2024    8:36 AM 09/17/2023   11:33 AM 02/04/2023   12:00 AM  CBC  WBC 3.8 - 10.8 Thousand/uL 9.0  9.1  9.9   Hemoglobin 13.2 - 17.1 g/dL 83.4  84.0  84.3   Hematocrit 38.5 - 50.0 % 48.7  48.7  45.7   Platelets 140 - 400 Thousand/uL 354  355  305    Lab Results  Component Value Date   IRON 90 03/20/2024   TIBC 291 03/20/2024   FERRITIN 80 03/20/2024    I personally reviewed and interpreted the available labs, imaging and endoscopic files.  Impression and Plan: Aaron Phillips is a 52 year old male with a history of ileocolonic Crohns disease diagnosed at age 14, complicated by perianal fistula and fistulizing colonic disease, presenting for routine follow-up.   The patient has presented significant control of his Crohn's disease while taking Skyrizi  every 8 weeks.  His symptoms have much more improved  although he is still have intermittent mild discharge from the perianal area, went with 2 antibiotics round with metronidazole  and ciprofloxacin  .  We discussed that there is not a lot of data for a Skyrizi  for management of perianal disease, most of the data supports the use of infliximab or Rinvoq.  For now, as his discharge is not very severe we will keep monitoring but if his discharge worsens, he can take a course of ciprofloxacin .  Also, may cause repeating an MRI of the pelvis to evaluate the fistulous track at that point.   In terms of his preventative measures, patient PCP retired and he is still looking for a new PCP he will benefit from obtaining shingles and pneumonia vaccination.  He  will will also need to follow-up with his dermatologist on a yearly basis; I will send a referral  Patient is due for Skyrizi  dose today and he will obtain lab work and if elevated CRP; For patients on risankizumab  360 mg subcutaneously every 8 weeks experiencing breakthrough symptoms, intravenous rescue therapy with 600 mg risankizumab  followed by resumption of 360 mg subcutaneously every 8 weeks is the recommended escalation strategy.   -Continue Skyrizi  every 8 weeks -Continue avoiding tobacco use -Recommend following with PCP for shingles and pneumonia vaccination - Dermatology referral - complete set of lab work today point -Patient has elicited high CRP at times of his disease and can use as monitoring disease activity -Will repeat hepatitis B and QuantiFERON next year  All questions were answered.      Aaron Fernandez Faizan Hitomi Slape, MD Gastroenterology and Hepatology [facility] Gastroenterology   This chart has been completed using [facility] Dictation software, and while attempts have been made to ensure accuracy , certain words and phrases may not be transcribed as intended  Thanks     [1]  Social History Tobacco Use  Smoking Status Every Day   Current packs/day: 0.50   Types: Cigarettes   Passive exposure: Current  Smokeless Tobacco Former   Types: Chew  Tobacco Comments   Patient states that he uses dip nicotine  Salts occasional  [2] No Known Allergies

## 2024-12-10 LAB — VITAMIN D 25 HYDROXY (VIT D DEFICIENCY, FRACTURES): Vit D, 25-Hydroxy: 41.9 ng/mL (ref 30.0–100.0)

## 2024-12-10 LAB — C-REACTIVE PROTEIN: CRP: 3 mg/L (ref 0–10)

## 2024-12-10 LAB — COMPREHENSIVE METABOLIC PANEL WITH GFR
ALT: 18 IU/L (ref 0–44)
AST: 19 IU/L (ref 0–40)
Albumin: 4.6 g/dL (ref 3.8–4.9)
Alkaline Phosphatase: 62 IU/L (ref 47–123)
BUN/Creatinine Ratio: 13 (ref 9–20)
BUN: 15 mg/dL (ref 6–24)
Bilirubin Total: 0.9 mg/dL (ref 0.0–1.2)
CO2: 24 mmol/L (ref 20–29)
Calcium: 9.9 mg/dL (ref 8.7–10.2)
Chloride: 98 mmol/L (ref 96–106)
Creatinine, Ser: 1.18 mg/dL (ref 0.76–1.27)
Globulin, Total: 2.8 g/dL (ref 1.5–4.5)
Glucose: 84 mg/dL (ref 70–99)
Potassium: 5.1 mmol/L (ref 3.5–5.2)
Sodium: 137 mmol/L (ref 134–144)
Total Protein: 7.4 g/dL (ref 6.0–8.5)
eGFR: 74 mL/min/1.73 (ref >59)

## 2024-12-10 LAB — CBC
Hematocrit: 51.4 % — ABNORMAL HIGH (ref 37.5–51.0)
Hemoglobin: 17.3 g/dL (ref 13.0–17.7)
MCH: 31.2 pg (ref 26.6–33.0)
MCHC: 33.7 g/dL (ref 31.5–35.7)
MCV: 93 fL (ref 79–97)
Platelets: 376 x10E3/uL (ref 150–450)
RBC: 5.54 x10E6/uL (ref 4.14–5.80)
RDW: 12.6 % (ref 11.6–15.4)
WBC: 10.7 x10E3/uL (ref 3.4–10.8)

## 2024-12-10 LAB — FERRITIN: Ferritin: 158 ng/mL (ref 30–400)

## 2024-12-10 LAB — VITAMIN B12: Vitamin B-12: 428 pg/mL (ref 232–1245)

## 2024-12-11 ENCOUNTER — Ambulatory Visit (INDEPENDENT_AMBULATORY_CARE_PROVIDER_SITE_OTHER): Payer: Self-pay | Admitting: Gastroenterology

## 2024-12-11 NOTE — Progress Notes (Signed)
 Labs 12/19  Hemoglobin 17.3 Normal liver enzymes CRP 3 Vitamin D  41 Ferritin 158 Vitamin B12 428  Continue current management

## 2025-08-10 ENCOUNTER — Ambulatory Visit: Admitting: Physician Assistant
